# Patient Record
Sex: Female | Born: 1975 | Race: Black or African American | Hispanic: No | Marital: Single | Smoking: Current every day smoker
Health system: Southern US, Community
[De-identification: ages and names within clinical notes are randomized; demographics above are authoritative.]

## PROBLEM LIST (undated history)

## (undated) DIAGNOSIS — R590 Localized enlarged lymph nodes: Secondary | ICD-10-CM

## (undated) DIAGNOSIS — I10 Essential (primary) hypertension: Secondary | ICD-10-CM

## (undated) DIAGNOSIS — K219 Gastro-esophageal reflux disease without esophagitis: Secondary | ICD-10-CM

## (undated) DIAGNOSIS — D219 Benign neoplasm of connective and other soft tissue, unspecified: Secondary | ICD-10-CM

## (undated) DIAGNOSIS — N946 Dysmenorrhea, unspecified: Secondary | ICD-10-CM

## (undated) DIAGNOSIS — B009 Herpesviral infection, unspecified: Secondary | ICD-10-CM

## (undated) HISTORY — DX: Dysmenorrhea, unspecified: N94.6

## (undated) HISTORY — DX: Localized enlarged lymph nodes: R59.0

## (undated) HISTORY — DX: Gastro-esophageal reflux disease without esophagitis: K21.9

## (undated) HISTORY — DX: Benign neoplasm of connective and other soft tissue, unspecified: D21.9

## (undated) HISTORY — PX: BREAST BIOPSY: SHX20

## (undated) HISTORY — DX: Herpesviral infection, unspecified: B00.9

---

## 2000-12-24 ENCOUNTER — Encounter: Admission: RE | Admit: 2000-12-24 | Discharge: 2000-12-24 | Payer: Self-pay | Admitting: Family Medicine

## 2001-05-09 ENCOUNTER — Encounter (INDEPENDENT_AMBULATORY_CARE_PROVIDER_SITE_OTHER): Payer: Self-pay | Admitting: *Deleted

## 2001-06-08 ENCOUNTER — Encounter: Admission: RE | Admit: 2001-06-08 | Discharge: 2001-06-08 | Payer: Self-pay | Admitting: Family Medicine

## 2001-06-08 ENCOUNTER — Encounter (INDEPENDENT_AMBULATORY_CARE_PROVIDER_SITE_OTHER): Payer: Self-pay | Admitting: *Deleted

## 2001-06-10 ENCOUNTER — Encounter: Admission: RE | Admit: 2001-06-10 | Discharge: 2001-06-10 | Payer: Self-pay | Admitting: Family Medicine

## 2001-06-11 ENCOUNTER — Encounter: Admission: RE | Admit: 2001-06-11 | Discharge: 2001-06-11 | Payer: Self-pay | Admitting: Family Medicine

## 2001-06-15 ENCOUNTER — Encounter: Admission: RE | Admit: 2001-06-15 | Discharge: 2001-06-15 | Payer: Self-pay | Admitting: Family Medicine

## 2003-02-10 ENCOUNTER — Inpatient Hospital Stay (HOSPITAL_COMMUNITY): Admission: AD | Admit: 2003-02-10 | Discharge: 2003-02-10 | Payer: Self-pay | Admitting: Obstetrics & Gynecology

## 2004-08-23 ENCOUNTER — Inpatient Hospital Stay (HOSPITAL_COMMUNITY): Admission: AD | Admit: 2004-08-23 | Discharge: 2004-08-23 | Payer: Self-pay | Admitting: Obstetrics & Gynecology

## 2005-05-17 ENCOUNTER — Encounter: Admission: RE | Admit: 2005-05-17 | Discharge: 2005-05-17 | Payer: Self-pay | Admitting: Internal Medicine

## 2006-05-03 ENCOUNTER — Inpatient Hospital Stay (HOSPITAL_COMMUNITY): Admission: AD | Admit: 2006-05-03 | Discharge: 2006-05-03 | Payer: Self-pay | Admitting: Obstetrics & Gynecology

## 2006-05-09 ENCOUNTER — Encounter (INDEPENDENT_AMBULATORY_CARE_PROVIDER_SITE_OTHER): Payer: Self-pay | Admitting: *Deleted

## 2006-07-18 ENCOUNTER — Inpatient Hospital Stay (HOSPITAL_COMMUNITY): Admission: AD | Admit: 2006-07-18 | Discharge: 2006-07-18 | Payer: Self-pay | Admitting: Obstetrics

## 2006-08-07 ENCOUNTER — Inpatient Hospital Stay (HOSPITAL_COMMUNITY): Admission: AD | Admit: 2006-08-07 | Discharge: 2006-08-07 | Payer: Self-pay | Admitting: Obstetrics

## 2006-08-23 ENCOUNTER — Inpatient Hospital Stay (HOSPITAL_COMMUNITY): Admission: AD | Admit: 2006-08-23 | Discharge: 2006-08-23 | Payer: Self-pay | Admitting: Obstetrics & Gynecology

## 2006-08-25 ENCOUNTER — Encounter: Payer: Self-pay | Admitting: Obstetrics

## 2006-08-25 ENCOUNTER — Ambulatory Visit (HOSPITAL_COMMUNITY): Admission: AD | Admit: 2006-08-25 | Discharge: 2006-08-25 | Payer: Self-pay | Admitting: Obstetrics

## 2007-09-10 ENCOUNTER — Inpatient Hospital Stay (HOSPITAL_COMMUNITY): Admission: AD | Admit: 2007-09-10 | Discharge: 2007-09-10 | Payer: Self-pay | Admitting: Obstetrics & Gynecology

## 2007-09-14 ENCOUNTER — Ambulatory Visit (HOSPITAL_COMMUNITY): Admission: RE | Admit: 2007-09-14 | Discharge: 2007-09-14 | Payer: Self-pay | Admitting: Obstetrics

## 2007-09-15 ENCOUNTER — Emergency Department (HOSPITAL_COMMUNITY): Admission: EM | Admit: 2007-09-15 | Discharge: 2007-09-15 | Payer: Self-pay | Admitting: Emergency Medicine

## 2007-10-11 IMAGING — US US OB COMP LESS 14 WK
1 series · 14 of 28 positions shown · non-contrast
Comparison: none

CLINICAL DATA: 13-14 weeks pregnant, bleeding.

TRANSABDOMINAL AND TRANSVAGINAL OB ULTRASOUND, LESS THAN 14 WEEKS:

[Series 1: us ob comp +14 wk · 14 of 30 slices shown]
[im 2/30]
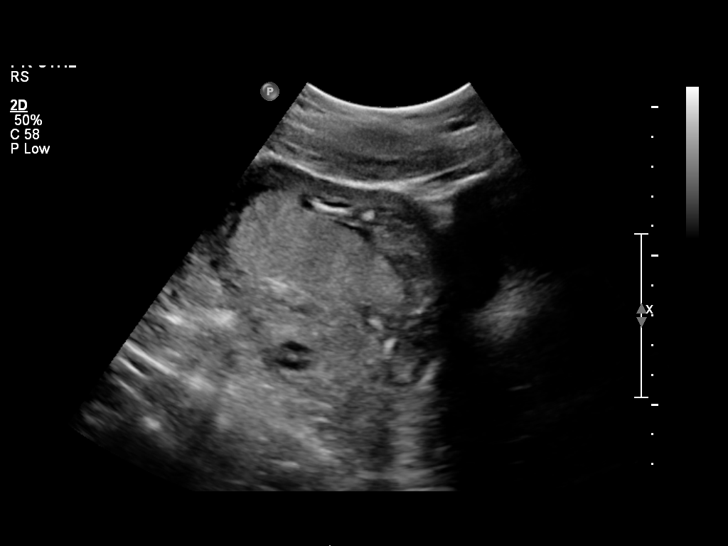
[im 4/30]
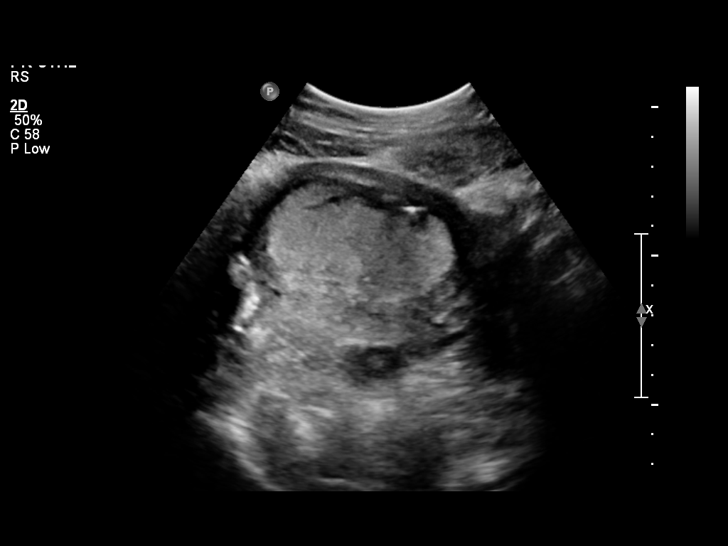
[im 6/30]
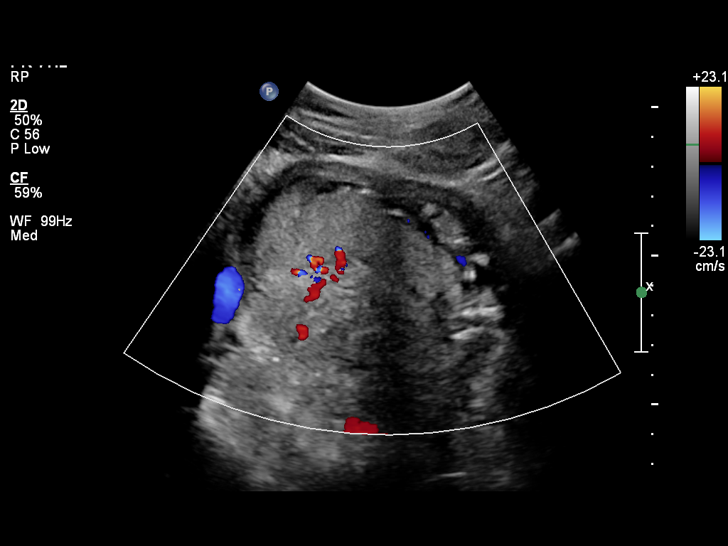
[im 8/30]
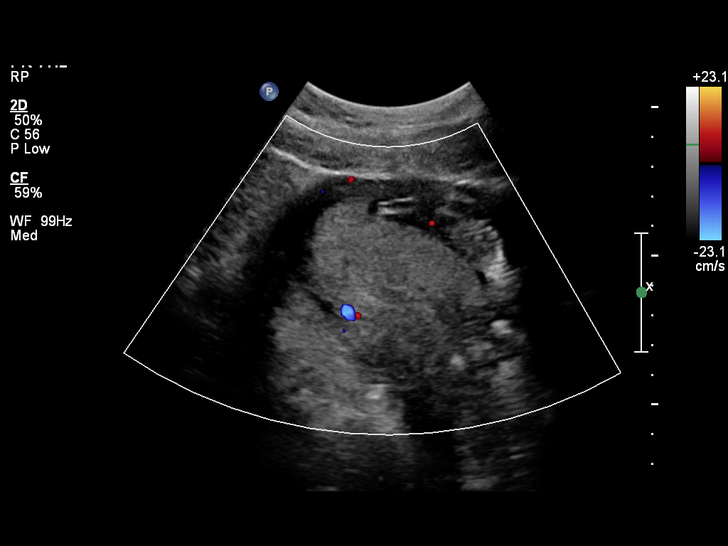
[im 10/30]
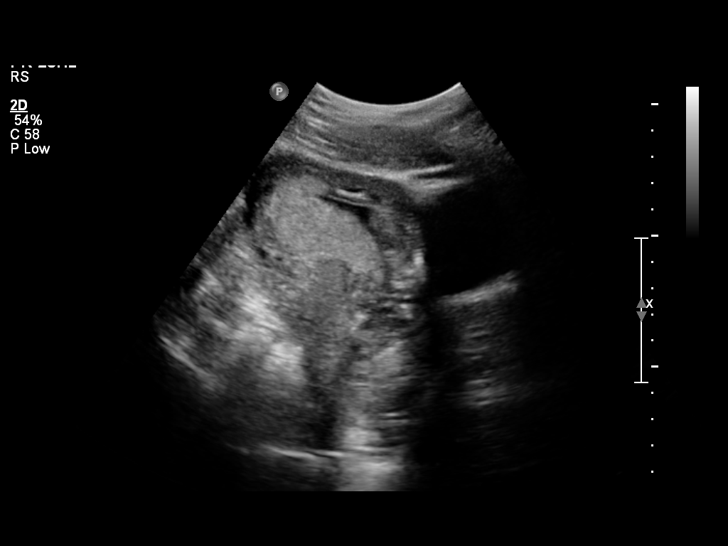
[im 12/30]
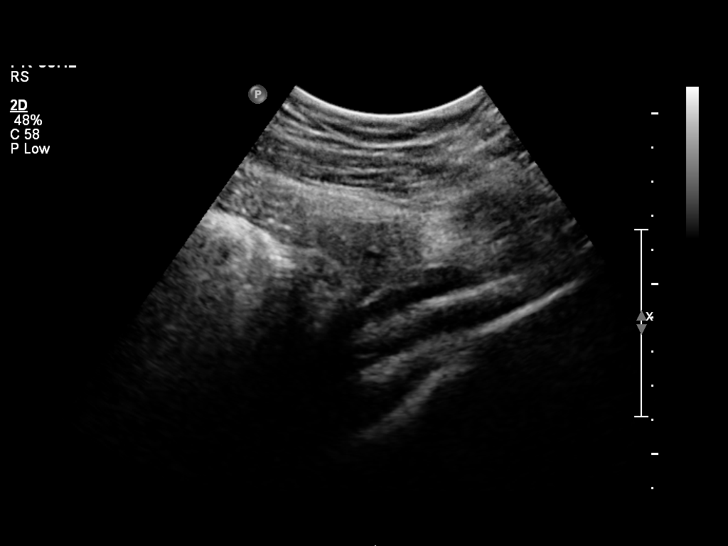
[im 14/30]
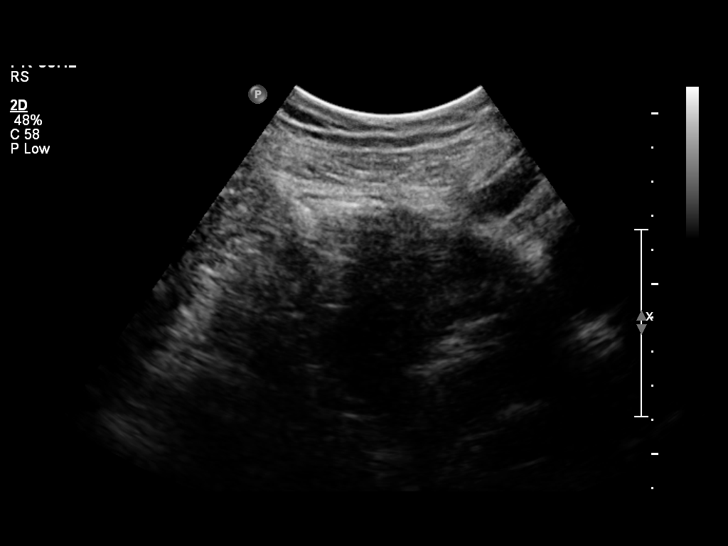
[im 17/30]
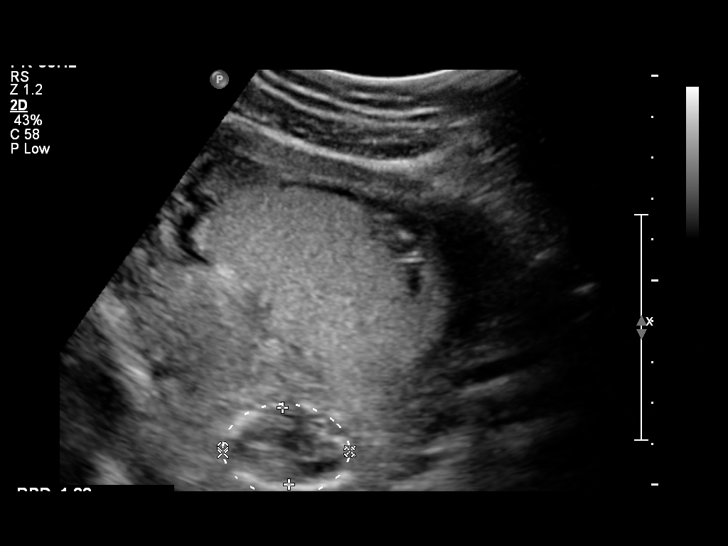
[im 19/30]
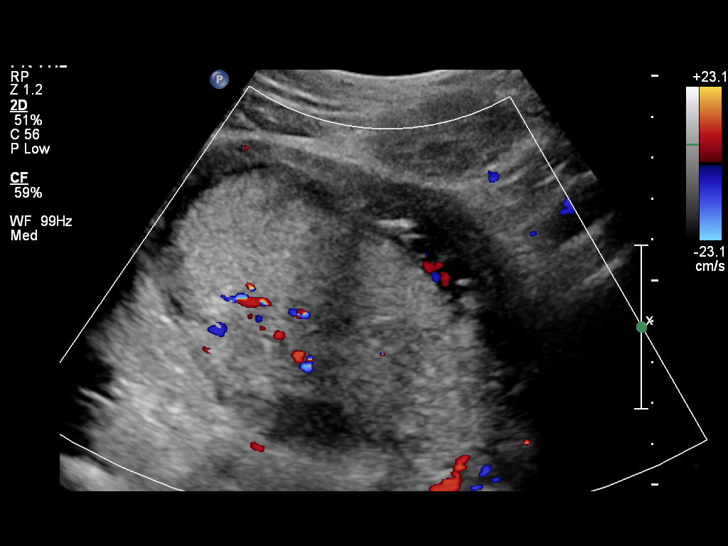
[im 21/30]
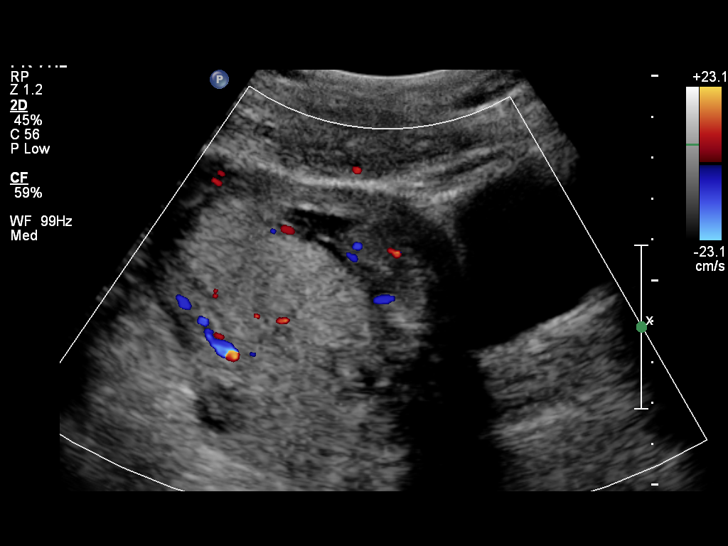
[im 23/30]
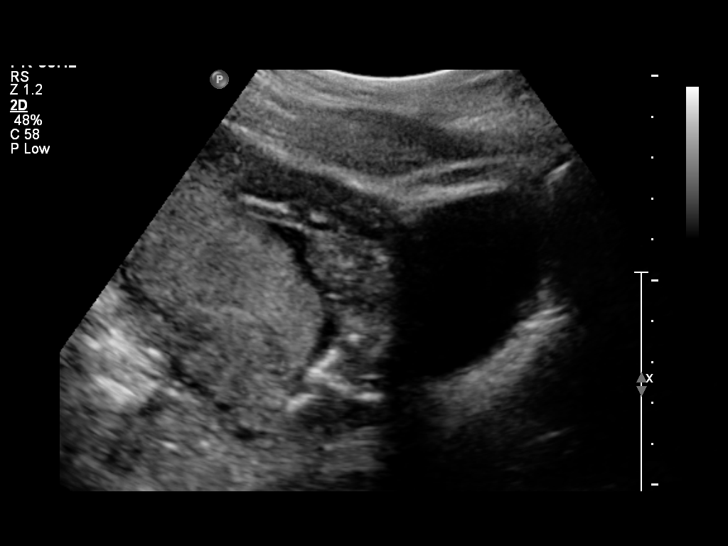
[im 25/30]
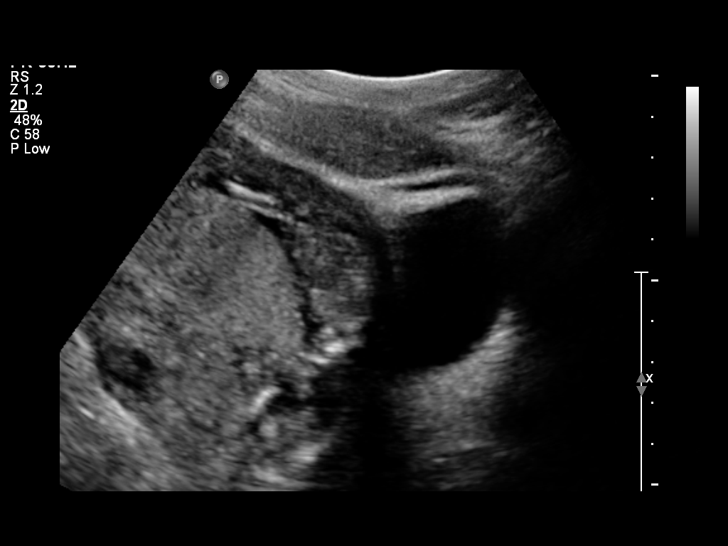
[im 27/30]
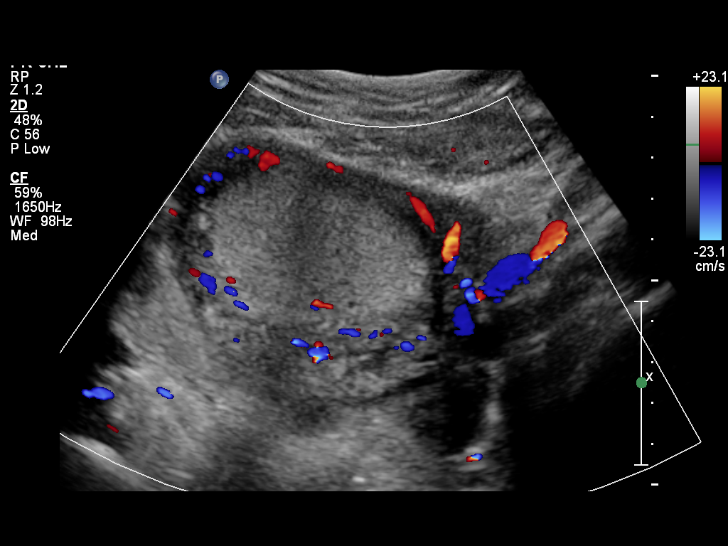
[im 30/30]
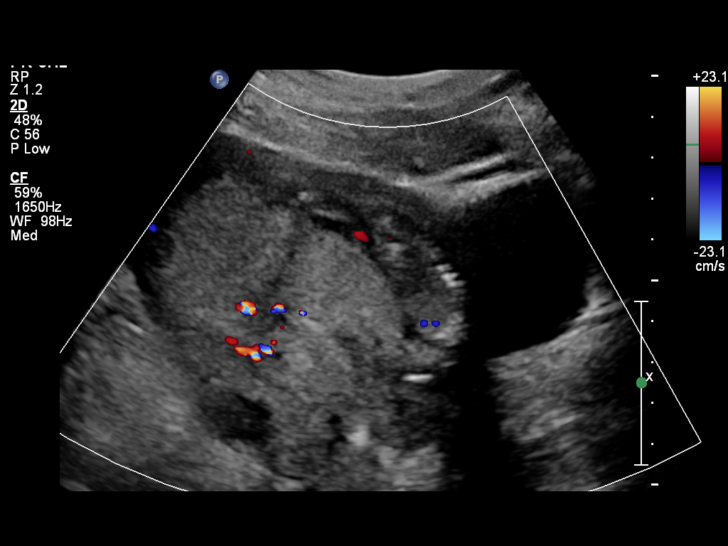

[14 of 28 positions shown; findings below may reference images not displayed]

FINDINGS: Amniotic fluid is subjectively decreased, changed since prior study,
with very difficult visualization of the fetus on the current exam. Given this
decrease in fluid, question rupture of membranes. Recommend clinical
correlation. Feet is very difficult to visualize, with posterior to the
placenta, heterogeneous density material is noted. This may represent a large
subchorionic hemorrhage.

Fetal measurements estimated gestational age at 13 weeks 5 days. Fetal heart
rate 172 beats per minute.
IMPRESSION: Very difficult to visualize fetus due to decreased amniotic fluid since prior
study. Question ruptured membranes. Recommend clinical correlation. Fetal heart
rate 172 beats per minute.

Very heterogeneous soft tissue posterior to the placenta suggesting the
possibility of large subchorionic hemorrhage.

## 2008-07-06 ENCOUNTER — Emergency Department (HOSPITAL_COMMUNITY): Admission: EM | Admit: 2008-07-06 | Discharge: 2008-07-06 | Payer: Self-pay | Admitting: Obstetrics & Gynecology

## 2010-06-20 LAB — POCT URINALYSIS DIP (DEVICE)
Nitrite: NEGATIVE
Specific Gravity, Urine: 1.015 (ref 1.005–1.030)
Urobilinogen, UA: 0.2 mg/dL (ref 0.0–1.0)
pH: 5.5 (ref 5.0–8.0)

## 2010-06-20 LAB — GC/CHLAMYDIA PROBE AMP, URINE
Chlamydia, Swab/Urine, PCR: NEGATIVE
GC Probe Amp, Urine: NEGATIVE

## 2010-07-24 NOTE — Op Note (Signed)
Tiffany [name], Tiffany [name]                ACCOUNT NO.:  [id number]   MEDICAL RECORD NO.:  [id number]          PATIENT TYPE:  MAT   LOCATION:  [facility]                          FACILITY:  [facility]   PHYSICIAN:  Charles A. Clearance Coots, M.D.DATE OF BIRTH:  Oct 14, 1975   DATE OF PROCEDURE:  08/25/2006  DATE OF DISCHARGE:                               OPERATIVE REPORT   PREOPERATIVE DIAGNOSIS:  Spontaneous abortion, incomplete.   POSTOPERATIVE DIAGNOSIS:  Spontaneous abortion, incomplete.   PROCEDURE:  Suction dilation and evacuation.   SURGEON:  Coral Ceo, M.D.   ANESTHESIA:  MAC with paracervical block.   ESTIMATED BLOOD LOSS:  100 mL   COMPLICATIONS:  None.   SPECIMEN:  Products of conception.   OPERATION:  The patient was brought to operating room and after  satisfactory IV sedation, legs were brought up in stirrups and the  vagina was prepped and draped in the usual sterile fashion. The urinary  bladder was emptied of approximately 50 mL clear urine.  Bimanual  examination revealed uterus to be mid position. A sterile speculum was  inserted vaginal vault and cervix was noted to be dilated with products  of conception in the cervical os. The products of conception were  removed from the cervical os with a ring forceps and submitted to  pathology for evaluation. Paracervical block of 20 mL of 1% Xylocaine  was injected, 10 mL in each lateral fornix. A #10 suction cath was then  easily introduced to the uterine cavity and all contents were evacuated.  The endometrial surface felt gritty at the conclusion of the procedure.  The endometrial surface was then curetted with medium sharp curette and  the scant amount of products of conception were obtained and submitted  to pathology for evaluation.  There was no active bleeding at the  conclusion of the procedure. All instruments were retired.  Bimanual  examination revealed uterus to be contracted down quite well. The  patient tolerated the  procedure well, transported to recovery room in  satisfactory condition.      Charles A. Clearance Coots, M.D.  Electronically Signed     CAH/MEDQ  D:  08/25/2006  T:  08/25/2006  Job:  [id number]

## 2010-12-06 LAB — URINALYSIS, ROUTINE W REFLEX MICROSCOPIC
Nitrite: NEGATIVE
Protein, ur: NEGATIVE
Specific Gravity, Urine: >1.03 — ABNORMAL HIGH
pH: 5.5

## 2010-12-06 LAB — POCT PREGNANCY, URINE
Operator id: 23433
Preg Test, Ur: NEGATIVE

## 2010-12-06 LAB — URINE MICROSCOPIC-ADD ON

## 2010-12-26 LAB — CBC
Hemoglobin: 12.4
MCV: 97.3
RDW: 12.8

## 2010-12-26 LAB — SAMPLE TO BLOOD BANK

## 2010-12-27 LAB — CBC
HCT: 35.8 — ABNORMAL LOW
Hemoglobin: 12.5
MCHC: 35
MCV: 97.2
Platelets: 305
RDW: 12.7
WBC: 9.4

## 2011-08-22 ENCOUNTER — Other Ambulatory Visit: Payer: Self-pay | Admitting: Internal Medicine

## 2011-08-22 DIAGNOSIS — N83209 Unspecified ovarian cyst, unspecified side: Secondary | ICD-10-CM

## 2011-08-26 ENCOUNTER — Ambulatory Visit
Admission: RE | Admit: 2011-08-26 | Discharge: 2011-08-26 | Disposition: A | Discharge status: Home / Self Care | Payer: Medicaid Other | Source: Ambulatory Visit | Attending: Internal Medicine | Admitting: Internal Medicine

## 2011-08-26 DIAGNOSIS — N83209 Unspecified ovarian cyst, unspecified side: Secondary | ICD-10-CM

## 2011-12-04 LAB — CBC AND DIFFERENTIAL
HCT: 46 % (ref 36–46)
Hemoglobin: 15.7 g/dL (ref 12.0–16.0)
Platelets: 286 10*3/uL (ref 150–399)

## 2011-12-06 LAB — HM PAP SMEAR: HM Pap smear: NEGATIVE

## 2012-05-05 ENCOUNTER — Encounter: Payer: Self-pay | Admitting: *Deleted

## 2012-05-08 ENCOUNTER — Encounter: Payer: Self-pay | Admitting: *Deleted

## 2012-08-27 ENCOUNTER — Ambulatory Visit (INDEPENDENT_AMBULATORY_CARE_PROVIDER_SITE_OTHER): Payer: Medicaid Other | Admitting: Obstetrics & Gynecology

## 2012-08-27 ENCOUNTER — Encounter: Payer: Self-pay | Admitting: Obstetrics & Gynecology

## 2012-08-27 VITALS — BP 129/80 | HR 87 | Temp 98.1°F | Ht 67.5 in | Wt 165.4 lb

## 2012-08-27 DIAGNOSIS — N926 Irregular menstruation, unspecified: Secondary | ICD-10-CM

## 2012-08-27 DIAGNOSIS — N946 Dysmenorrhea, unspecified: Secondary | ICD-10-CM

## 2012-08-27 NOTE — Progress Notes (Signed)
.   Subjective:     Tiffany [name] is a 37 y.o. female here for a follow up exam with her birth control (abnormal bleeding).  No current complaints.  Personal health questionnaire reviewed: no.   Gynecologic History No LMP recorded. Contraception: Seasonique Last Pap: 11/2011. Results were: N/A Last mammogram: N/A  Obstetric History OB History   Grav Para Term Preterm Abortions TAB SAB Ect Mult Living   2 1  1 1  1         # Outc Date GA Lbr Len/2nd Wgt Sex Del Anes PTL Lv   1 PRE 1/96 [redacted]w[redacted]d  1lb5oz(0.595kg)     SB   2 SAB 6/08 [redacted]w[redacted]d              The following portions of the patient's history were reviewed and updated as appropriate: allergies, current medications, past family history, past medical history, past social history, past surgical history and problem list.  Review of Systems Pertinent items are noted in HPI.    Objective:     No exam today     Assessment:   Doing well w/COCP  Plan:   Return in September for annual

## 2012-08-27 NOTE — Patient Instructions (Signed)

## 2012-12-07 ENCOUNTER — Ambulatory Visit: Payer: Medicaid Other | Admitting: Obstetrics & Gynecology

## 2013-02-03 ENCOUNTER — Telehealth: Payer: Self-pay | Admitting: *Deleted

## 2013-05-05 ENCOUNTER — Encounter: Payer: Self-pay | Admitting: Obstetrics & Gynecology

## 2013-05-05 ENCOUNTER — Ambulatory Visit (INDEPENDENT_AMBULATORY_CARE_PROVIDER_SITE_OTHER): Payer: BC Managed Care – PPO | Admitting: Obstetrics & Gynecology

## 2013-05-05 VITALS — BP 127/82 | HR 81 | Temp 97.9°F | Ht 67.5 in | Wt 159.0 lb

## 2013-05-05 DIAGNOSIS — Z124 Encounter for screening for malignant neoplasm of cervix: Secondary | ICD-10-CM

## 2013-05-05 DIAGNOSIS — Z113 Encounter for screening for infections with a predominantly sexual mode of transmission: Secondary | ICD-10-CM

## 2013-05-05 DIAGNOSIS — Z01419 Encounter for gynecological examination (general) (routine) without abnormal findings: Secondary | ICD-10-CM

## 2013-05-05 DIAGNOSIS — Z13220 Encounter for screening for lipoid disorders: Secondary | ICD-10-CM

## 2013-05-05 MED ORDER — NORETHINDRONE 0.35 MG PO TABS: 1.0000 | ORAL_TABLET | Freq: Every day | ORAL | Status: DC

## 2013-05-05 NOTE — Progress Notes (Signed)
Subjective:     Tiffany [name] is a 38 y.o. female here for a routine exam.  Current complaints: Patient is in the office today for an Annual Exam. Patient states she takes her birth control pill everyday and vitamin B12 everyday she would like to know what effect it will have on her kidneys.   Personal health questionnaire reviewed: yes.   Gynecologic History No LMP recorded. Patient is not currently having periods (Reason: Oral contraceptives). Contraception: OCP (estrogen/progesterone) Last Pap: 12/04/2011. Results were: normal  Obstetric History OB History  Gravida Para Term Preterm AB SAB TAB Ectopic Multiple Living  2 1  1 1 1         # Outcome Date GA Lbr Len/2nd Weight Sex Delivery Anes PTL Lv  2 SAB 08/25/06 [redacted]w[redacted]d         1 PRE 03/11/94 [redacted]w[redacted]d  1 lb 5 oz (0.595 kg)     SB       The following portions of the patient's history were reviewed and updated as appropriate: allergies, current medications, past family history, past medical history, past social history, past surgical history and problem list.  Review of Systems Pertinent items are noted in HPI.    Objective:    BP 127/82  Pulse 81  Temp(Src) 97.9 F (36.6 C)  Ht 5' 7.5" (1.715 m)  Wt 159 lb (72.122 kg)  BMI 24.52 kg/m2  General Appearance:    Alert, cooperative, no distress, appears stated age  Breast Exam:    No tenderness, masses, or nipple abnormality  Abdomen:     Soft, non-tender, bowel sounds active all four quadrants,    no masses, no organomegaly  Genitalia:    Normal female without lesion, discharge or tenderness   Assessment:   Healthy female exam.   Plan:   Yearly papsmears Change to [place] in light of the pt's smoking history Counseled re: smoking cessation

## 2013-05-06 LAB — LIPID PANEL
CHOL/HDL RATIO: 3 ratio
Cholesterol: 160 mg/dL (ref 0–200)
HDL: 53 mg/dL (ref >39)
LDL CALC: 74 mg/dL (ref 0–99)
Triglycerides: 167 mg/dL — ABNORMAL HIGH (ref <150)
VLDL: 33 mg/dL (ref 0–40)

## 2013-05-06 LAB — HIV ANTIBODY (ROUTINE TESTING W REFLEX): HIV: NONREACTIVE

## 2013-05-06 LAB — RPR

## 2013-05-06 NOTE — Patient Instructions (Signed)
Smoking Cessation Quitting smoking is important to your health and has many advantages. However, it is not always easy to quit since nicotine is a very addictive drug. Often times, people try 3 times or more before being able to quit. This document explains the best ways for you to prepare to quit smoking. Quitting takes hard work and a lot of effort, but you can do it. ADVANTAGES OF QUITTING SMOKING  You will live longer, feel better, and live better.  Your body will feel the impact of quitting smoking almost immediately.  Within 20 minutes, blood pressure decreases. Your pulse returns to its normal level.  After 8 hours, carbon monoxide levels in the blood return to normal. Your oxygen level increases.  After 24 hours, the chance of having a heart attack starts to decrease. Your breath, hair, and body stop smelling like smoke.  After 48 hours, damaged nerve endings begin to recover. Your sense of taste and smell improve.  After 72 hours, the body is virtually free of nicotine. Your bronchial tubes relax and breathing becomes easier.  After 2 to 12 weeks, lungs can hold more air. Exercise becomes easier and circulation improves.  The risk of having a heart attack, stroke, cancer, or lung disease is greatly reduced.  After 1 year, the risk of coronary heart disease is cut in half.  After 5 years, the risk of stroke falls to the same as a nonsmoker.  After 10 years, the risk of lung cancer is cut in half and the risk of other cancers decreases significantly.  After 15 years, the risk of coronary heart disease drops, usually to the level of a nonsmoker.  If you are pregnant, quitting smoking will improve your chances of having a healthy baby.  The people you live with, especially any children, will be healthier.  You will have extra money to spend on things other than cigarettes. QUESTIONS TO THINK ABOUT BEFORE ATTEMPTING TO QUIT You may want to talk about your answers with your  caregiver.  Why do you want to quit?  If you tried to quit in the past, what helped and what did not?  What will be the most difficult situations for you after you quit? How will you plan to handle them?  Who can help you through the tough times? Your family? Friends? A caregiver?  What pleasures do you get from smoking? What ways can you still get pleasure if you quit? Here are some questions to ask your caregiver:  How can you help me to be successful at quitting?  What medicine do you think would be best for me and how should I take it?  What should I do if I need more help?  What is smoking withdrawal like? How can I get information on withdrawal? GET READY  Set a quit date.  Change your environment by getting rid of all cigarettes, ashtrays, matches, and lighters in your home, car, or work. Do not let people smoke in your home.  Review your past attempts to quit. Think about what worked and what did not. GET SUPPORT AND ENCOURAGEMENT You have a better chance of being successful if you have help. You can get support in many ways.  Tell your family, friends, and co-workers that you are going to quit and need their support. Ask them not to smoke around you.  Get individual, group, or telephone counseling and support. Programs are available at [employer] and health centers. Call your local health department for  information about programs in your area.  Spiritual beliefs and practices may help some smokers quit.  Download a "quit meter" on your computer to keep track of quit statistics, such as how long you have gone without smoking, cigarettes not smoked, and money saved.  Get a self-help book about quitting smoking and staying off of tobacco. [place] yourself from urges to smoke. Talk to someone, go for a walk, or occupy your time with a task.  Change your normal routine. Take a different route to work. Drink tea instead of coffee.  Eat breakfast in a different place.  Reduce your stress. Take a hot bath, exercise, or read a book.  Plan something enjoyable to do every day. Reward yourself for not smoking.  Explore interactive web-based programs that specialize in helping you quit. GET MEDICINE AND USE IT CORRECTLY Medicines can help you stop smoking and decrease the urge to smoke. Combining medicine with the above behavioral methods and support can greatly increase your chances of successfully quitting smoking.  Nicotine replacement therapy helps deliver nicotine to your body without the negative effects and risks of smoking. Nicotine replacement therapy includes nicotine gum, lozenges, inhalers, nasal sprays, and skin patches. Some may be available over-the-counter and others require a prescription.  Antidepressant medicine helps people abstain from smoking, but how this works is unknown. This medicine is available by prescription.  Nicotinic receptor partial agonist medicine simulates the effect of nicotine in your brain. This medicine is available by prescription. Ask your caregiver for advice about which medicines to use and how to use them based on your health history. Your caregiver will tell you what side effects to look out for if you choose to be on a medicine or therapy. Carefully read the information on the package. Do not use any other product containing nicotine while using a nicotine replacement product.  RELAPSE OR DIFFICULT SITUATIONS Most relapses occur within the first 3 months after quitting. Do not be discouraged if you start smoking again. Remember, most people try several times before finally quitting. You may have symptoms of withdrawal because your body is used to nicotine. You may crave cigarettes, be irritable, feel very hungry, cough often, get headaches, or have difficulty concentrating. The withdrawal symptoms are only temporary. They are strongest when you first quit, but they will go away within  10 14 days. To reduce the chances of relapse, try to:  Avoid drinking alcohol. Drinking lowers your chances of successfully quitting.  Reduce the amount of caffeine you consume. Once you quit smoking, the amount of caffeine in your body increases and can give you symptoms, such as a rapid heartbeat, sweating, and anxiety.  Avoid smokers because they can make you want to smoke.  Do not let weight gain distract you. Many smokers will gain weight when they quit, usually less than 10 pounds. Eat a healthy diet and stay active. You can always lose the weight gained after you quit.  Find ways to improve your mood other than smoking. FOR MORE INFORMATION  www.smokefree.gov  Document Released: 02/19/2001 Document Revised: 08/27/2011 Document Reviewed: 06/06/2011 [facility] Patient Information 2014 [place], [place]. Health Maintenance, Female A healthy lifestyle and preventative care can promote health and wellness.  Maintain regular health, dental, and eye exams.  Eat a healthy diet. Foods like vegetables, fruits, whole grains, low-fat dairy products, and lean protein foods contain the nutrients you need without too many calories. Decrease your intake of foods high in solid fats,  added sugars, and salt. Get information about a proper diet from your caregiver, if necessary.  Regular physical exercise is one of the most important things you can do for your health. Most adults should get at least 150 minutes of moderate-intensity exercise (any activity that increases your heart rate and causes you to sweat) each week. In addition, most adults need muscle-strengthening exercises on 2 or more days a week.   Maintain a healthy weight. The body mass index (BMI) is a screening tool to identify possible weight problems. It provides an estimate of body fat based on height and weight. Your caregiver can help determine your BMI, and can help you achieve or maintain a healthy weight. For adults 20 years and  older:  A BMI below 18.5 is considered underweight.  A BMI of 18.5 to 24.9 is normal.  A BMI of 25 to 29.9 is considered overweight.  A BMI of 30 and above is considered obese.  Maintain normal blood lipids and cholesterol by exercising and minimizing your intake of saturated fat. Eat a balanced diet with plenty of fruits and vegetables. Blood tests for lipids and cholesterol should begin at age 37 and be repeated every 5 years. If your lipid or cholesterol levels are high, you are over 50, or you are a high risk for heart disease, you may need your cholesterol levels checked more frequently.Ongoing high lipid and cholesterol levels should be treated with medicines if diet and exercise are not effective.  If you smoke, find out from your caregiver how to quit. If you do not use tobacco, do not start.  Lung cancer screening is recommended for adults aged 68 80 years who are at high risk for developing lung cancer because of a history of smoking. Yearly low-dose computed tomography (CT) is recommended for people who have at least a 30-pack-year history of smoking and are a current smoker or have quit within the past 15 years. A pack year of smoking is smoking an average of 1 pack of cigarettes a day for 1 year (for example: 1 pack a day for 30 years or 2 packs a day for 15 years). Yearly screening should continue until the smoker has stopped smoking for at least 15 years. Yearly screening should also be stopped for people who develop a health problem that would prevent them from having lung cancer treatment.  If you are pregnant, do not drink alcohol. If you are breastfeeding, be very cautious about drinking alcohol. If you are not pregnant and choose to drink alcohol, do not exceed 1 drink per day. One drink is considered to be 12 ounces (355 mL) of beer, 5 ounces (148 mL) of wine, or 1.5 ounces (44 mL) of liquor.  Avoid use of street drugs. Do not share needles with anyone. Ask for help if you  need support or instructions about stopping the use of drugs.  High blood pressure causes heart disease and increases the risk of stroke. Blood pressure should be checked at least every 1 to 2 years. Ongoing high blood pressure should be treated with medicines, if weight loss and exercise are not effective.  If you are 58 to 36 years old, ask your caregiver if you should take aspirin to prevent strokes.  Diabetes screening involves taking a blood sample to check your fasting blood sugar level. This should be done once every 3 years, after age 60, if you are within normal weight and without risk factors for diabetes. Testing should be considered at  a younger age or be carried out more frequently if you are overweight and have at least 1 risk factor for diabetes.  Breast cancer screening is essential preventative care for women. You should practice "breast self-awareness." This means understanding the normal appearance and feel of your breasts and may include breast self-examination. Any changes detected, no matter how small, should be reported to a caregiver. Women in their 38s and 30s should have a clinical breast exam (CBE) by a caregiver as part of a regular health exam every 1 to 3 years. After age 70, women should have a CBE every year. Starting at age 36, women should consider having a mammogram (breast X-ray) every year. Women who have a family history of breast cancer should talk to their caregiver about genetic screening. Women at a high risk of breast cancer should talk to their caregiver about having an MRI and a mammogram every year.  Breast cancer gene (BRCA)-related cancer risk assessment is recommended for women who have family members with BRCA-related cancers. BRCA-related cancers include breast, ovarian, tubal, and peritoneal cancers. Having family members with these cancers may be associated with an increased risk for harmful changes (mutations) in the breast cancer genes BRCA1 and BRCA2.  Results of the assessment will determine the need for genetic counseling and BRCA1 and BRCA2 testing.  The Pap test is a screening test for cervical cancer. Women should have a Pap test starting at age 25. Between ages 86 and 39, Pap tests should be repeated every 2 years. Beginning at age 78, you should have a Pap test every 3 years as long as the past 3 Pap tests have been normal. If you had a hysterectomy for a problem that was not cancer or a condition that could lead to cancer, then you no longer need Pap tests. If you are between ages 90 and 59, and you have had normal Pap tests going back 10 years, you no longer need Pap tests. If you have had past treatment for cervical cancer or a condition that could lead to cancer, you need Pap tests and screening for cancer for at least 20 years after your treatment. If Pap tests have been discontinued, risk factors (such as a new sexual partner) need to be reassessed to determine if screening should be resumed. Some women have medical problems that increase the chance of getting cervical cancer. In these cases, your caregiver may recommend more frequent screening and Pap tests.  The human papillomavirus (HPV) test is an additional test that may be used for cervical cancer screening. The HPV test looks for the virus that can cause the cell changes on the cervix. The cells collected during the Pap test can be tested for HPV. The HPV test could be used to screen women aged 71 years and older, and should be used in women of any age who have unclear Pap test results. After the age of 21, women should have HPV testing at the same frequency as a Pap test.  Colorectal cancer can be detected and often prevented. Most routine colorectal cancer screening begins at the age of 11 and continues through age 54. However, your caregiver may recommend screening at an earlier age if you have risk factors for colon cancer. On a yearly basis, your caregiver may provide home test kits  to check for hidden blood in the stool. Use of a small camera at the end of a tube, to directly examine the colon (sigmoidoscopy or colonoscopy), can detect the earliest  forms of colorectal cancer. Talk to your caregiver about this at age 7, when routine screening begins. Direct examination of the colon should be repeated every 5 to 10 years through age 40, unless early forms of pre-cancerous polyps or small growths are found.  Hepatitis C blood testing is recommended for all people born from [birth year] through 1963 and any individual with known risks for hepatitis C.  Practice safe sex. Use condoms and avoid high-risk sexual practices to reduce the spread of sexually transmitted infections (STIs). Sexually active women aged 66 and younger should be checked for Chlamydia, which is a common sexually transmitted infection. Older women with new or multiple partners should also be tested for Chlamydia. Testing for other STIs is recommended if you are sexually active and at increased risk.  Osteoporosis is a disease in which the bones lose minerals and strength with aging. This can result in serious bone fractures. The risk of osteoporosis can be identified using a bone density scan. Women ages 52 and over and women at risk for fractures or osteoporosis should discuss screening with their caregivers. Ask your caregiver whether you should be taking a calcium supplement or vitamin D to reduce the rate of osteoporosis.  Menopause can be associated with physical symptoms and risks. Hormone replacement therapy is available to decrease symptoms and risks. You should talk to your caregiver about whether hormone replacement therapy is right for you.  Use sunscreen. Apply sunscreen liberally and repeatedly throughout the day. You should seek shade when your shadow is shorter than you. Protect yourself by wearing long sleeves, pants, a wide-brimmed hat, and sunglasses year round, whenever you are outdoors.  Notify your  caregiver of new moles or changes in moles, especially if there is a change in shape or color. Also notify your caregiver if a mole is larger than the size of a pencil eraser.  Stay current with your immunizations. Document Released: 09/10/2010 Document Revised: 06/22/2012 Document Reviewed: 09/10/2010 [facility] Patient Information 2014 [place].

## 2013-05-07 LAB — PAP IG, CT-NG, RFX HPV ASCU
CHLAMYDIA PROBE AMP: NEGATIVE
GC PROBE AMP: NEGATIVE

## 2013-05-07 LAB — HSV(HERPES SIMPLEX VRS) I + II AB-IGG
HSV 1 Glycoprotein G Ab, IgG: 9.42 IV — ABNORMAL HIGH
HSV 2 Glycoprotein G Ab, IgG: 11.97 IV — ABNORMAL HIGH

## 2013-05-11 ENCOUNTER — Encounter: Payer: Self-pay | Admitting: Obstetrics & Gynecology

## 2013-05-11 DIAGNOSIS — A6000 Herpesviral infection of urogenital system, unspecified: Secondary | ICD-10-CM | POA: Insufficient documentation

## 2014-01-10 ENCOUNTER — Encounter: Payer: Self-pay | Admitting: Obstetrics & Gynecology

## 2014-03-07 ENCOUNTER — Encounter: Payer: Self-pay | Admitting: *Deleted

## 2014-03-08 ENCOUNTER — Encounter: Payer: Self-pay | Admitting: Obstetrics & Gynecology

## 2014-04-07 NOTE — Telephone Encounter (Signed)
error 

## 2014-04-11 ENCOUNTER — Other Ambulatory Visit: Payer: Self-pay | Admitting: *Deleted

## 2014-04-11 DIAGNOSIS — N926 Irregular menstruation, unspecified: Secondary | ICD-10-CM

## 2014-04-11 MED ORDER — NORETHINDRONE 0.35 MG PO TABS: 1.0000 | ORAL_TABLET | Freq: Every day | ORAL | Status: DC

## 2014-05-05 ENCOUNTER — Ambulatory Visit: Payer: Self-pay | Admitting: Certified Nurse Midwife

## 2014-05-05 ENCOUNTER — Ambulatory Visit: Payer: BC Managed Care – PPO | Admitting: Obstetrics & Gynecology

## 2014-05-18 ENCOUNTER — Encounter: Payer: Self-pay | Admitting: Certified Nurse Midwife

## 2014-05-18 ENCOUNTER — Ambulatory Visit (INDEPENDENT_AMBULATORY_CARE_PROVIDER_SITE_OTHER): Payer: BLUE CROSS/BLUE SHIELD | Admitting: Certified Nurse Midwife

## 2014-05-18 VITALS — BP 130/85 | HR 94 | Temp 98.6°F | Wt 160.0 lb

## 2014-05-18 DIAGNOSIS — Z01419 Encounter for gynecological examination (general) (routine) without abnormal findings: Secondary | ICD-10-CM

## 2014-05-18 DIAGNOSIS — R350 Frequency of micturition: Secondary | ICD-10-CM

## 2014-05-18 DIAGNOSIS — R030 Elevated blood-pressure reading, without diagnosis of hypertension: Secondary | ICD-10-CM

## 2014-05-18 DIAGNOSIS — N946 Dysmenorrhea, unspecified: Secondary | ICD-10-CM

## 2014-05-18 DIAGNOSIS — Z113 Encounter for screening for infections with a predominantly sexual mode of transmission: Secondary | ICD-10-CM

## 2014-05-18 DIAGNOSIS — F17200 Nicotine dependence, unspecified, uncomplicated: Secondary | ICD-10-CM

## 2014-05-18 DIAGNOSIS — IMO0001 Reserved for inherently not codable concepts without codable children: Secondary | ICD-10-CM

## 2014-05-18 DIAGNOSIS — M79671 Pain in right foot: Secondary | ICD-10-CM

## 2014-05-18 NOTE — Progress Notes (Signed)
Patient ID: Tiffany [name], female   DOB: 1975/07/16, 39 y.o.   MRN: [id number]   Subjective:     Tiffany [name] is a 39 y.o. female here for a routine exam.  Current complaints: right foot pain, needs internal medicine provider, and reports frequent urination >10X/hr. Denies dysuria or hematuria.  Strong family hx of BCA <33 years of age in the family.  Declines change in OCP at this time.  Is a current smoker, declines medication at this time and wants to try to quit on her own.    Has a hx of fibroids.  Does have cyclical left ovary pain.  Does not want any more children.    Personal health questionnaire:  Is patient [name], have a family history of breast and/or ovarian cancer: yes Is there a family history of uterine cancer diagnosed at age < 17, gastrointestinal cancer, urinary tract cancer, family member who is a Field seismologist syndrome-associated carrier: yes Is the patient overweight and hypertensive, family history of diabetes, personal history of gestational diabetes, preeclampsia or PCOS: yes Is patient over 71, have PCOS,  family history of premature CHD under age 16, diabetes, smoke, have hypertension or peripheral artery disease:  no At any time, has a partner hit, kicked or otherwise hurt or frightened you?: no Over the past 2 weeks, have you felt down, depressed or hopeless?: no Over the past 2 weeks, have you felt little interest or pleasure in doing things?:no   Gynecologic History No LMP recorded. Patient is not currently having periods (Reason: Oral contraceptives). Contraception: OCP (estrogen/progesterone) Last Pap: 01/02/14. Results were: normal Last mammogram: none.   Obstetric History OB History  Gravida Para Term Preterm AB SAB TAB Ectopic Multiple Living  2 1  1 1 1         # Outcome Date GA Lbr Len/2nd Weight Sex Delivery Anes PTL Lv  2 SAB 08/25/06 [redacted]w[redacted]d         1 Preterm 03/11/94 [redacted]w[redacted]d  0.595 kg (1 lb 5 oz)     FD      History reviewed. No pertinent past  medical history.  Past Surgical History  Procedure Laterality Date  . Cesarean section       Current outpatient prescriptions:  .  norethindrone (MICRONOR,CAMILA,ERRIN) 0.35 MG tablet, Take 1 tablet (0.35 mg total) by mouth daily., Disp: 1 Package, Rfl: 5 .  vitamin B-12 (CYANOCOBALAMIN) 100 MCG tablet, Take 50 mcg by mouth daily., Disp: , Rfl:  No Known Allergies  History  Substance Use Topics  . Smoking status: Current Every Day Smoker -- 0.50 packs/day for 10 years    Types: Cigarettes    Last Attempt to Quit: 01/10/2012  . Smokeless tobacco: Never Used  . Alcohol Use: 1.8 oz/week    3 Shots of liquor per week    Family History  Problem Relation Age of Onset  . Hypertension Mother   . Kidney disease Mother   . Diabetes Maternal Aunt   . Cancer Maternal Aunt     breast  . Hypertension Maternal Grandmother       Review of Systems  Constitutional: negative for fatigue and weight loss Respiratory: negative for cough and wheezing Cardiovascular: negative for chest pain, fatigue and palpitations Gastrointestinal: negative for abdominal pain and change in bowel habits Musculoskeletal:negative for myalgias Neurological: negative for gait problems and tremors Behavioral/Psych: negative for abusive relationship, depression Endocrine: negative for temperature intolerance   Genitourinary:negative for abnormal menstrual periods, genital lesions, hot flashes, sexual  problems and vaginal discharge Integument/breast: negative for breast lump, breast tenderness, nipple discharge and skin lesion(s)    Objective:       BP 130/85 mmHg  Pulse 94  Temp(Src) 98.6 F (37 C)  Wt 72.576 kg (160 lb)  LMP  General:   alert  Skin:   no rash or abnormalities  Lungs:   clear to auscultation bilaterally  Heart:   regular rate and rhythm, S1, S2 normal, no murmur, click, rub or gallop  Breasts:   normal without suspicious masses, skin or nipple changes or axillary nodes  Abdomen:  normal  findings: no organomegaly, soft, non-tender and no hernia  Pelvis:  External genitalia: normal general appearance Urinary system: urethral meatus normal and bladder without fullness, nontender Vaginal: normal without tenderness, induration or masses Cervix: normal appearance Adnexa: normal bimanual exam Uterus: anteverted and non-tender, normal size   Lab Review Urine pregnancy test Labs reviewed yes Radiologic studies reviewed yes    Assessment:    Healthy female exam.   Dysmenorrhea Polyuria   Plan:    Education reviewed: low fat, low cholesterol diet, safe sex/STD prevention, self breast exams, skin cancer screening, smoking cessation and weight bearing exercise. Contraception: OCP (estrogen/progesterone). Mammogram ordered. Follow up in: 3 months.   No orders of the defined types were placed in this encounter.   Orders Placed This Encounter  Procedures  . SureSwab, Vaginosis/Vaginitis Plus  . MM DIGITAL SCREENING BILATERAL    Standing Status: Future     Number of Occurrences:      Standing Expiration Date: 07/18/2015    Order Specific Question:  Reason for Exam (SYMPTOM  OR DIAGNOSIS REQUIRED)    Answer:  early BCA in family    Order Specific Question:  Is the patient pregnant?    Answer:  No    Order Specific Question:  Preferred imaging location?    Answer:  [facility]  . US Transvaginal Non-OB    Standing Status: Future     Number of Occurrences:      Standing Expiration Date: 07/18/2015    Order Specific Question:  Reason for Exam (SYMPTOM  OR DIAGNOSIS REQUIRED)    Answer:  hx of fibroids    Order Specific Question:  Preferred imaging location?    Answer:  [facility]  . HIV antibody (with reflex)  . Hepatitis B surface antigen  . RPR  . Hepatitis C antibody  . Ambulatory referral to Nephrology    Referral Priority:  Routine    Referral Type:  Consultation    Referral Reason:  Specialty Services Required    Requested Specialty:  Nephrology    Number  of Visits Requested:  1  . Ambulatory referral to Podiatry    Referral Priority:  Routine    Referral Type:  Consultation    Referral Reason:  Specialty Services Required    Requested Specialty:  Podiatry    Number of Visits Requested:  1  . Ambulatory referral to Internal Medicine    Referral Priority:  Routine    Referral Type:  Consultation    Referral Reason:  Specialty Services Required    Requested Specialty:  Internal Medicine    Number of Visits Requested:  1    Possible management options include: uterine fibroid embolization, chantix/wellbutrin.

## 2014-05-19 LAB — PAP IG AND HPV HIGH-RISK: HPV DNA HIGH RISK: NOT DETECTED

## 2014-05-19 LAB — HIV ANTIBODY (ROUTINE TESTING W REFLEX): HIV: NONREACTIVE

## 2014-05-19 LAB — HEPATITIS C ANTIBODY: HCV AB: NEGATIVE

## 2014-05-19 LAB — HEPATITIS B SURFACE ANTIGEN: Hepatitis B Surface Ag: NEGATIVE

## 2014-05-19 LAB — RPR

## 2014-05-20 ENCOUNTER — Telehealth: Payer: Self-pay

## 2014-05-20 ENCOUNTER — Other Ambulatory Visit: Payer: Self-pay | Admitting: Certified Nurse Midwife

## 2014-05-20 DIAGNOSIS — N946 Dysmenorrhea, unspecified: Secondary | ICD-10-CM

## 2014-05-20 NOTE — Telephone Encounter (Signed)
PATIENT HAS APPT MONDAY 05/23/14 AT 11AM

## 2014-05-21 LAB — SURESWAB, VAGINOSIS/VAGINITIS PLUS
ATOPOBIUM VAGINAE: 6.4 Log (cells/mL)
C. ALBICANS, DNA: NOT DETECTED
C. TROPICALIS, DNA: NOT DETECTED
C. glabrata, DNA: NOT DETECTED
C. parapsilosis, DNA: NOT DETECTED
C. trachomatis RNA, TMA: NOT DETECTED
GARDNERELLA VAGINALIS: 7 Log (cells/mL)
LACTOBACILLUS SPECIES: NOT DETECTED Log (cells/mL)
MEGASPHAERA SPECIES: 7.3 Log (cells/mL)
N. GONORRHOEAE RNA, TMA: NOT DETECTED
T. vaginalis RNA, QL TMA: NOT DETECTED

## 2014-05-23 ENCOUNTER — Other Ambulatory Visit: Payer: Self-pay | Admitting: Certified Nurse Midwife

## 2014-05-23 ENCOUNTER — Telehealth: Payer: Self-pay

## 2014-05-23 ENCOUNTER — Ambulatory Visit (HOSPITAL_COMMUNITY)
Admission: RE | Admit: 2014-05-23 | Discharge: 2014-05-23 | Disposition: A | Discharge status: Home / Self Care | Payer: BLUE CROSS/BLUE SHIELD | Source: Ambulatory Visit | Attending: Certified Nurse Midwife | Admitting: Certified Nurse Midwife

## 2014-05-23 DIAGNOSIS — Z803 Family history of malignant neoplasm of breast: Secondary | ICD-10-CM

## 2014-05-23 DIAGNOSIS — Z1231 Encounter for screening mammogram for malignant neoplasm of breast: Secondary | ICD-10-CM | POA: Insufficient documentation

## 2014-05-23 DIAGNOSIS — Z01419 Encounter for gynecological examination (general) (routine) without abnormal findings: Secondary | ICD-10-CM

## 2014-05-23 NOTE — Telephone Encounter (Signed)
Patient has referrals scheduled with [place], [place] for mammogram, [facility] for ultrasound and only waiting on urology/nephrology - will call patient when I get that one

## 2014-05-24 ENCOUNTER — Telehealth: Payer: Self-pay

## 2014-05-24 NOTE — Telephone Encounter (Signed)
patient sch with [facility] on 06/20/14 at 8:30am - left vm for patient - she has all her referral appts sch

## 2014-05-25 ENCOUNTER — Ambulatory Visit (HOSPITAL_COMMUNITY): Payer: Medicaid Other

## 2014-06-01 ENCOUNTER — Ambulatory Visit (INDEPENDENT_AMBULATORY_CARE_PROVIDER_SITE_OTHER): Payer: BLUE CROSS/BLUE SHIELD

## 2014-06-01 ENCOUNTER — Other Ambulatory Visit: Payer: Self-pay | Admitting: Certified Nurse Midwife

## 2014-06-01 DIAGNOSIS — N946 Dysmenorrhea, unspecified: Secondary | ICD-10-CM

## 2014-06-01 DIAGNOSIS — Z803 Family history of malignant neoplasm of breast: Secondary | ICD-10-CM | POA: Diagnosis not present

## 2014-06-02 ENCOUNTER — Ambulatory Visit: Payer: BLUE CROSS/BLUE SHIELD | Admitting: Podiatry

## 2014-06-03 ENCOUNTER — Other Ambulatory Visit: Payer: Self-pay | Admitting: *Deleted

## 2014-06-03 DIAGNOSIS — B9689 Other specified bacterial agents as the cause of diseases classified elsewhere: Secondary | ICD-10-CM

## 2014-06-03 DIAGNOSIS — N760 Acute vaginitis: Principal | ICD-10-CM

## 2014-06-03 MED ORDER — METRONIDAZOLE 500 MG PO TABS: 500.0000 mg | ORAL_TABLET | Freq: Two times a day (BID) | ORAL | Status: DC

## 2014-06-15 ENCOUNTER — Ambulatory Visit: Payer: BLUE CROSS/BLUE SHIELD | Admitting: Certified Nurse Midwife

## 2014-06-21 ENCOUNTER — Ambulatory Visit: Payer: BLUE CROSS/BLUE SHIELD | Admitting: Family

## 2014-07-07 ENCOUNTER — Ambulatory Visit (INDEPENDENT_AMBULATORY_CARE_PROVIDER_SITE_OTHER): Payer: BLUE CROSS/BLUE SHIELD | Admitting: Family

## 2014-07-07 ENCOUNTER — Encounter: Payer: Self-pay | Admitting: Family

## 2014-07-07 VITALS — BP 118/88 | HR 82 | Temp 99.0°F | Resp 18 | Ht 67.5 in | Wt 162.0 lb

## 2014-07-07 DIAGNOSIS — S0180XA Unspecified open wound of other part of head, initial encounter: Secondary | ICD-10-CM | POA: Diagnosis not present

## 2014-07-07 DIAGNOSIS — Z0000 Encounter for general adult medical examination without abnormal findings: Secondary | ICD-10-CM | POA: Insufficient documentation

## 2014-07-07 MED ORDER — SULFAMETHOXAZOLE-TRIMETHOPRIM 800-160 MG PO TABS: 1.0000 | ORAL_TABLET | Freq: Two times a day (BID) | ORAL | Status: DC

## 2014-07-07 NOTE — Assessment & Plan Note (Signed)
Small ulceration noted anterior to right ear. Start Bactrim to cover for MRSA. Refer to dermatology for further workup. Follow-up if symptoms worsen or fail to improve.

## 2014-07-07 NOTE — Assessment & Plan Note (Signed)
1) Anticipatory Guidance: Discussed importance of wearing a seatbelt while driving and not texting while driving; changing batteries in smoke detector at least once annually; wearing suntan lotion when outside; eating a balanced and moderate diet; getting physical activity at least 30 minutes per day.  2) Immunizations / Screenings / Labs:  All immunizations are up-to-date per recommendations. All screenings are up-to-date per recommendations. Obtain CBC, BMET, Lipid profile, iron and TSH and fasting.   Overall well exam. Patient's current cardiovascular risk factors include a sedentary lifestyle and tobacco use. Goal will be to increase physical activity to 30 minutes of moderate intensity most days of the week. 4 tobacco use discussed briefly methods of quitting tobacco use including gums, patches, and medications. Patient is currently contemplating decreasing her smoking. Continue other current healthy lifestyle behaviors. Follow-up prevention exam in one year. Follow up office visit pending blood work.

## 2014-07-07 NOTE — Progress Notes (Signed)
Pre visit review using our clinic review tool, if applicable. No additional management support is needed unless otherwise documented below in the visit note. 

## 2014-07-07 NOTE — Progress Notes (Signed)
Subjective:    Patient ID: Tiffany [name], female    DOB: November 17, 1975, 39 y.o.   MRN: [id number]  Chief Complaint  Patient presents with  . Establish Care    states that she has a hole right beside her ear that has white stuff that comes out that smells, also wants blood work done    HPI:  Tiffany [name] is a 39 y.o. female who presents today for an annual wellness visit.   1) Health Maintenance -   Diet - Averages about 2 meals per day. Consists of fruits, vegetables, fast food; Caffeine about 2-3 cups per day  Exercise - No structured exercise program.    2) Preventative Exams / Immunizations:  Dental -- Up to date  Vision -- Up to date   Health Maintenance  Topic Date Due  . INFLUENZA VACCINE  10/10/2014  . PAP SMEAR  05/17/2017  . TETANUS/TDAP  07/07/2023  . HIV Screening  Completed    There is no immunization history on file for this patient.  No Known Allergies  No current outpatient prescriptions on file prior to visit.   No current facility-administered medications on file prior to visit.    Past Medical History  Diagnosis Date  . Fibroids   . Dysmenorrhea     Past Surgical History  Procedure Laterality Date  . Cesarean section    . Breast biopsy      Family History  Problem Relation Age of Onset  . Hypertension Mother   . Kidney disease Mother   . Diabetes Maternal Aunt   . Cancer Maternal Aunt     breast  . Hypertension Maternal Grandmother     History   Social History  . Marital Status: Single    Spouse Name: N/A  . Number of Children: 1  . Years of Education: 16   Occupational History  . Sales Associate    Social History Main Topics  . Smoking status: Current Every Day Smoker -- 0.50 packs/day for 10 years    Types: Cigarettes  . Smokeless tobacco: Never Used  . Alcohol Use: 1.8 oz/week    3 Shots of liquor per week  . Drug Use: No  . Sexual Activity:    Partners: Male    Birth Control/ Protection: Pill   Other  Topics Concern  . Not on file   Social History Narrative   Fun: Watching tv   Denies any religious beliefs effecting health care.      Review of Systems  Constitutional: Denies fever, chills, fatigue, or significant weight gain/loss. HENT: Head: Denies headache or neck pain Ears: Denies changes in hearing, ringing in ears, earache, drainage Right ear - Associated symptom of a hole located anterior to her right ear has been there for at least 5 years and recently noted that it is getting bigger. Notes that when she squeezes it some white discharge is noted and has a foul odor to it. Denies any modifying factors that make it better or worse. Denies any pain. Nose: Denies discharge, stuffiness, itching, nosebleed, sinus pain Throat: Denies sore throat, hoarseness, dry mouth, sores, thrush Eyes: Denies loss/changes in vision, pain, redness, blurry/double vision, flashing lights Cardiovascular: Denies chest pain/discomfort, tightness, palpitations, shortness of breath with activity, difficulty lying down, swelling, sudden awakening with shortness of breath Respiratory: Denies shortness of breath, cough, sputum production, wheezing Gastrointestinal: Denies dysphasia, heartburn, change in appetite, nausea, change in bowel habits, rectal bleeding, constipation, diarrhea, yellow skin or eyes Genitourinary:  Denies frequency, urgency, burning/pain, blood in urine, incontinence, change in urinary strength. Musculoskeletal: Denies muscle/joint pain, stiffness, back pain, redness or swelling of joints, trauma Skin: Denies rashes, lumps, itching, dryness, color changes, or hair/nail changes Neurological: Denies dizziness, fainting, seizures, weakness, numbness, tingling, tremor Psychiatric - Denies nervousness, stress, depression or memory loss Endocrine: Denies heat or cold intolerance, sweating, frequent urination, excessive thirst, changes in appetite Hematologic: Denies ease of bruising or  bleeding     Objective:    BP 118/88 mmHg  Pulse 82  Temp(Src) 99 F (37.2 C) (Oral)  Resp 18  Ht 5' 7.5" (1.715 m)  Wt 162 lb (73.483 kg)  BMI 24.98 kg/m2  SpO2 97% Nursing note and vital signs reviewed.  Physical Exam  Constitutional: She is oriented to person, place, and time. She appears well-developed and well-nourished.  HENT:  Head: Normocephalic.  Right Ear: Hearing, tympanic membrane, external ear and ear canal normal.  Left Ear: Hearing, tympanic membrane, external ear and ear canal normal.  Nose: Nose normal.  Mouth/Throat: Uvula is midline, oropharynx is clear and moist and mucous membranes are normal.  Approximately 1/4 cm irregularly shaped ulceration with no erythema noted anterior to right ear. Discharge upon pressure appears white with foul odor.  Eyes: Conjunctivae and EOM are normal. Pupils are equal, round, and reactive to light.  Neck: Neck supple. No JVD present. No tracheal deviation present. No thyromegaly present.  Cardiovascular: Normal rate, regular rhythm, normal heart sounds and intact distal pulses.   Pulmonary/Chest: Effort normal and breath sounds normal.  Abdominal: Soft. Bowel sounds are normal. She exhibits no distension and no mass. There is no tenderness. There is no rebound and no guarding.  Musculoskeletal: Normal range of motion. She exhibits no edema or tenderness.  Lymphadenopathy:    She has no cervical adenopathy.  Neurological: She is alert and oriented to person, place, and time. She has normal reflexes. No cranial nerve deficit. She exhibits normal muscle tone. Coordination normal.  Skin: Skin is warm and dry.  Psychiatric: She has a normal mood and affect. Her behavior is normal. Judgment and thought content normal.       Assessment & Plan:

## 2014-07-07 NOTE — Progress Notes (Deleted)
   Subjective:    Patient ID: Bing Neighbors, female    DOB: 01/20/1976, 39 y.o.   MRN: [id number]  Chief Complaint  Patient presents with  . Establish Care    states that she has a hole right beside her ear that has white stuff that comes out that smells, also wants blood work done    HPI:  SHIRAH ROSEMAN is a 39 y.o. female with a PMH of dysmenorrhea, irregular menstrual cycle and tobacco use who presents today for an office visit to establish care.   1) Right ear - Associated symptom of a hole located anterior to her right ear has been there for at least 5 years and recently noted that it is getting bigger. Notes that when she squeezes it some white discharge is noted and has a foul odor to it. Denies any modifying factors that make it better or worse. Denies any pain.  Review of Systems    Objective:    BP 118/88 mmHg  Pulse 82  Temp(Src) 99 F (37.2 C) (Oral)  Resp 18  Ht 5' 7.5" (1.715 m)  Wt 162 lb (73.483 kg)  BMI 24.98 kg/m2  SpO2 97%  Nursing note and vital signs reviewed.  Physical Exam     Assessment & Plan:

## 2014-07-07 NOTE — Patient Instructions (Addendum)
Thank you for choosing [facility].  Summary/Instructions:  Your prescription(s) have been submitted to your pharmacy or been printed and provided for you. Please take as directed and contact our office if you believe you are having problem(s) with the medication(s) or have any questions.  Please stop by the lab on the basement level of the building for your blood work. Your results will be released to [facility] (or called to you) after review, usually within 72 hours after test completion. If any changes need to be made, you will be notified at that same time.  Referrals have been made during this visit. You should expect to hear back from our schedulers in about 7-10 days in regards to establishing an appointment with the specialists we discussed.   If your symptoms worsen or fail to improve, please contact our office for further instruction, or in case of emergency go directly to the emergency room at the closest medical facility.   Health Maintenance Adopting a healthy lifestyle and getting preventive care can go a long way to promote health and wellness. Talk with your health care provider about what schedule of regular examinations is right for you. This is a good chance for you to check in with your provider about disease prevention and staying healthy. In between checkups, there are plenty of things you can do on your own. Experts have done a lot of research about which lifestyle changes and preventive measures are most likely to keep you healthy. Ask your health care provider for more information. WEIGHT AND DIET  Eat a healthy diet  Be sure to include plenty of vegetables, fruits, low-fat dairy products, and lean protein.  Do not eat a lot of foods high in solid fats, added sugars, or salt.  Get regular exercise. This is one of the most important things you can do for your health.  Most adults should exercise for at least 150 minutes each week. The exercise should increase your  heart rate and make you sweat (moderate-intensity exercise).  Most adults should also do strengthening exercises at least twice a week. This is in addition to the moderate-intensity exercise.  Maintain a healthy weight  Body mass index (BMI) is a measurement that can be used to identify possible weight problems. It estimates body fat based on height and weight. Your health care provider can help determine your BMI and help you achieve or maintain a healthy weight.  For females 5 years of age and older:   A BMI below 18.5 is considered underweight.  A BMI of 18.5 to 24.9 is normal.  A BMI of 25 to 29.9 is considered overweight.  A BMI of 30 and above is considered obese.  Watch levels of cholesterol and blood lipids  You should start having your blood tested for lipids and cholesterol at 37 years of age, then have this test every 5 years.  You may need to have your cholesterol levels checked more often if:  Your lipid or cholesterol levels are high.  You are older than 37 years of age.  You are at high risk for heart disease.  CANCER SCREENING   Lung Cancer  Lung cancer screening is recommended for adults 57-60 years old who are at high risk for lung cancer because of a history of smoking.  A yearly low-dose CT scan of the lungs is recommended for people who:  Currently smoke.  Have quit within the past 15 years.  Have at least a 30-pack-year history of smoking.  A pack year is smoking an average of one pack of cigarettes a day for 1 year.  Yearly screening should continue until it has been 15 years since you quit.  Yearly screening should stop if you develop a health problem that would prevent you from having lung cancer treatment.  Breast Cancer  Practice breast self-awareness. This means understanding how your breasts normally appear and feel.  It also means doing regular breast self-exams. Let your health care provider know about any changes, no matter how  small.  If you are in your 20s or 30s, you should have a clinical breast exam (CBE) by a health care provider every 1-3 years as part of a regular health exam.  If you are 61 or older, have a CBE every year. Also consider having a breast X-ray (mammogram) every year.  If you have a family history of breast cancer, talk to your health care provider about genetic screening.  If you are at high risk for breast cancer, talk to your health care provider about having an MRI and a mammogram every year.  Breast cancer gene (BRCA) assessment is recommended for women who have family members with BRCA-related cancers. BRCA-related cancers include:  Breast.  Ovarian.  Tubal.  Peritoneal cancers.  Results of the assessment will determine the need for genetic counseling and BRCA1 and BRCA2 testing. Cervical Cancer Routine pelvic examinations to screen for cervical cancer are no longer recommended for nonpregnant women who are considered low risk for cancer of the pelvic organs (ovaries, uterus, and vagina) and who do not have symptoms. A pelvic examination may be necessary if you have symptoms including those associated with pelvic infections. Ask your health care provider if a screening pelvic exam is right for you.   The Pap test is the screening test for cervical cancer for women who are considered at risk.  If you had a hysterectomy for a problem that was not cancer or a condition that could lead to cancer, then you no longer need Pap tests.  If you are older than 65 years, and you have had normal Pap tests for the past 10 years, you no longer need to have Pap tests.  If you have had past treatment for cervical cancer or a condition that could lead to cancer, you need Pap tests and screening for cancer for at least 20 years after your treatment.  If you no longer get a Pap test, assess your risk factors if they change (such as having a new sexual partner). This can affect whether you should  start being screened again.  Some women have medical problems that increase their chance of getting cervical cancer. If this is the case for you, your health care provider may recommend more frequent screening and Pap tests.  The human papillomavirus (HPV) test is another test that may be used for cervical cancer screening. The HPV test looks for the virus that can cause cell changes in the cervix. The cells collected during the Pap test can be tested for HPV.  The HPV test can be used to screen women 87 years of age and older. Getting tested for HPV can extend the interval between normal Pap tests from three to five years.  An HPV test also should be used to screen women of any age who have unclear Pap test results.  After 37 years of age, women should have HPV testing as often as Pap tests.  Colorectal Cancer  This type of cancer can be  detected and often prevented.  Routine colorectal cancer screening usually begins at 37 years of age and continues through 37 years of age.  Your health care provider may recommend screening at an earlier age if you have risk factors for colon cancer.  Your health care provider may also recommend using home test kits to check for hidden blood in the stool.  A small camera at the end of a tube can be used to examine your colon directly (sigmoidoscopy or colonoscopy). This is done to check for the earliest forms of colorectal cancer.  Routine screening usually begins at age 12.  Direct examination of the colon should be repeated every 5-10 years through 37 years of age. However, you may need to be screened more often if early forms of precancerous polyps or small growths are found. Skin Cancer  Check your skin from head to toe regularly.  Tell your health care provider about any new moles or changes in moles, especially if there is a change in a mole's shape or color.  Also tell your health care provider if you have a mole that is larger than the size  of a pencil eraser.  Always use sunscreen. Apply sunscreen liberally and repeatedly throughout the day.  Protect yourself by wearing long sleeves, pants, a wide-brimmed hat, and sunglasses whenever you are outside. HEART DISEASE, DIABETES, AND HIGH BLOOD PRESSURE   Have your blood pressure checked at least every 1-2 years. High blood pressure causes heart disease and increases the risk of stroke.  If you are between 52 years and 34 years old, ask your health care provider if you should take aspirin to prevent strokes.  Have regular diabetes screenings. This involves taking a blood sample to check your fasting blood sugar level.  If you are at a normal weight and have a low risk for diabetes, have this test once every three years after 37 years of age.  If you are overweight and have a high risk for diabetes, consider being tested at a younger age or more often. PREVENTING INFECTION  Hepatitis B  If you have a higher risk for hepatitis B, you should be screened for this virus. You are considered at high risk for hepatitis B if:  You were born in a country where hepatitis B is common. Ask your health care provider which countries are considered high risk.  Your parents were born in a high-risk country, and you have not been immunized against hepatitis B (hepatitis B vaccine).  You have HIV or AIDS.  You use needles to inject street drugs.  You live with someone who has hepatitis B.  You have had sex with someone who has hepatitis B.  You get hemodialysis treatment.  You take certain medicines for conditions, including cancer, organ transplantation, and autoimmune conditions. Hepatitis C  Blood testing is recommended for:  Everyone born from [birth year] through 1963.  Anyone with known risk factors for hepatitis C. Sexually transmitted infections (STIs)  You should be screened for sexually transmitted infections (STIs) including gonorrhea and chlamydia if:  You are sexually  active and are younger than 37 years of age.  You are older than 37 years of age and your health care provider tells you that you are at risk for this type of infection.  Your sexual activity has changed since you were last screened and you are at an increased risk for chlamydia or gonorrhea. Ask your health care provider if you are at risk.  If you do  not have HIV, but are at risk, it may be recommended that you take a prescription medicine daily to prevent HIV infection. This is called pre-exposure prophylaxis (PrEP). You are considered at risk if:  You are sexually active and do not regularly use condoms or know the HIV status of your partner(s).  You take drugs by injection.  You are sexually active with a partner who has HIV. Talk with your health care provider about whether you are at high risk of being infected with HIV. If you choose to begin PrEP, you should first be tested for HIV. You should then be tested every 3 months for as long as you are taking PrEP.  PREGNANCY   If you are premenopausal and you may become pregnant, ask your health care provider about preconception counseling.  If you may become pregnant, take 400 to 800 micrograms (mcg) of folic acid every day.  If you want to prevent pregnancy, talk to your health care provider about birth control (contraception). OSTEOPOROSIS AND MENOPAUSE   Osteoporosis is a disease in which the bones lose minerals and strength with aging. This can result in serious bone fractures. Your risk for osteoporosis can be identified using a bone density scan.  If you are 65 years of age or older, or if you are at risk for osteoporosis and fractures, ask your health care provider if you should be screened.  Ask your health care provider whether you should take a calcium or vitamin D supplement to lower your risk for osteoporosis.  Menopause may have certain physical symptoms and risks.  Hormone replacement therapy may reduce some of these  symptoms and risks. Talk to your health care provider about whether hormone replacement therapy is right for you.  HOME CARE INSTRUCTIONS   Schedule regular health, dental, and eye exams.  Stay current with your immunizations.   Do not use any tobacco products including cigarettes, chewing tobacco, or electronic cigarettes.  If you are pregnant, do not drink alcohol.  If you are breastfeeding, limit how much and how often you drink alcohol.  Limit alcohol intake to no more than 1 drink per day for nonpregnant women. One drink equals 12 ounces of beer, 5 ounces of wine, or 1 ounces of hard liquor.  Do not use street drugs.  Do not share needles.  Ask your health care provider for help if you need support or information about quitting drugs.  Tell your health care provider if you often feel depressed.  Tell your health care provider if you have ever been abused or do not feel safe at home. Document Released: 09/10/2010 Document Revised: 07/12/2013 Document Reviewed: 01/27/2013 ExitCare Patient Information 2015 [employer]. This information is not intended to replace advice given to you by your health care provider. Make sure you discuss any questions you have with your health care provider.   

## 2014-10-18 ENCOUNTER — Other Ambulatory Visit: Payer: Self-pay | Admitting: Obstetrics

## 2014-10-22 ENCOUNTER — Other Ambulatory Visit: Payer: Self-pay | Admitting: Obstetrics

## 2014-10-27 ENCOUNTER — Other Ambulatory Visit: Payer: Self-pay | Admitting: *Deleted

## 2014-10-27 ENCOUNTER — Telehealth: Payer: Self-pay | Admitting: *Deleted

## 2014-10-27 DIAGNOSIS — B9689 Other specified bacterial agents as the cause of diseases classified elsewhere: Secondary | ICD-10-CM

## 2014-10-27 DIAGNOSIS — N760 Acute vaginitis: Principal | ICD-10-CM

## 2014-10-27 MED ORDER — METRONIDAZOLE 500 MG PO TABS: 500.0000 mg | ORAL_TABLET | Freq: Two times a day (BID) | ORAL | Status: DC

## 2014-10-27 NOTE — Telephone Encounter (Signed)
Patient is having symptoms of BV. Patient is requesting a prescription. Patient states she is having a discharge with odor and slight itching. Per nursing protocol prescription sent to the pharmacy for treatment.

## 2014-11-12 ENCOUNTER — Other Ambulatory Visit: Payer: Self-pay | Admitting: Obstetrics

## 2014-11-17 ENCOUNTER — Other Ambulatory Visit: Payer: Self-pay | Admitting: Obstetrics

## 2015-01-23 ENCOUNTER — Other Ambulatory Visit: Payer: Self-pay | Admitting: Obstetrics

## 2015-02-22 ENCOUNTER — Other Ambulatory Visit: Payer: Self-pay | Admitting: Obstetrics

## 2015-02-23 ENCOUNTER — Other Ambulatory Visit: Payer: Self-pay | Admitting: Obstetrics

## 2015-03-27 ENCOUNTER — Telehealth: Payer: Self-pay | Admitting: *Deleted

## 2015-03-27 DIAGNOSIS — Z3041 Encounter for surveillance of contraceptive pills: Secondary | ICD-10-CM

## 2015-03-27 MED ORDER — NORETHINDRONE 0.35 MG PO TABS: 1.0000 | ORAL_TABLET | Freq: Every day | ORAL | Status: DC

## 2015-03-27 NOTE — Telephone Encounter (Signed)
Patient is calling for a refill of her birth control. Patient is due birth control 05/2015. Rx sent to pharmacy.

## 2015-06-23 ENCOUNTER — Other Ambulatory Visit: Payer: Self-pay | Admitting: Obstetrics

## 2015-06-28 ENCOUNTER — Telehealth: Payer: Self-pay | Admitting: *Deleted

## 2015-06-28 DIAGNOSIS — Z3041 Encounter for surveillance of contraceptive pills: Secondary | ICD-10-CM

## 2015-06-28 MED ORDER — NORETHINDRONE 0.35 MG PO TABS: ORAL_TABLET | ORAL | Status: DC

## 2015-06-28 NOTE — Telephone Encounter (Signed)
Patient need refill of her POP- she lives out of town and need to go ahead and get the refill.

## 2015-07-05 ENCOUNTER — Telehealth: Payer: Self-pay | Admitting: *Deleted

## 2015-07-05 NOTE — Telephone Encounter (Signed)
Patient just got into town and she does not have her POP with her. She would like [place] to give her a sample if we have it. Explained to patient we do not have samples- but offered to call in a substitute pack at [facility] for her. She is agreeable and thanks the office for helping her as she is commuting [place] now.

## 2015-09-26 ENCOUNTER — Encounter: Payer: Self-pay | Admitting: Certified Nurse Midwife

## 2015-09-26 ENCOUNTER — Encounter: Payer: Self-pay | Admitting: Gastroenterology

## 2015-09-26 ENCOUNTER — Ambulatory Visit (INDEPENDENT_AMBULATORY_CARE_PROVIDER_SITE_OTHER): Payer: Managed Care, Other (non HMO) | Admitting: Certified Nurse Midwife

## 2015-09-26 VITALS — BP 137/87 | HR 81 | Temp 98.4°F | Wt 161.0 lb

## 2015-09-26 DIAGNOSIS — Z01419 Encounter for gynecological examination (general) (routine) without abnormal findings: Secondary | ICD-10-CM | POA: Diagnosis not present

## 2015-09-26 DIAGNOSIS — R61 Generalized hyperhidrosis: Secondary | ICD-10-CM | POA: Insufficient documentation

## 2015-09-26 DIAGNOSIS — L732 Hidradenitis suppurativa: Secondary | ICD-10-CM

## 2015-09-26 DIAGNOSIS — Z800 Family history of malignant neoplasm of digestive organs: Secondary | ICD-10-CM | POA: Insufficient documentation

## 2015-09-26 DIAGNOSIS — Z113 Encounter for screening for infections with a predominantly sexual mode of transmission: Secondary | ICD-10-CM

## 2015-09-26 DIAGNOSIS — Z3041 Encounter for surveillance of contraceptive pills: Secondary | ICD-10-CM

## 2015-09-26 DIAGNOSIS — L739 Follicular disorder, unspecified: Secondary | ICD-10-CM | POA: Insufficient documentation

## 2015-09-26 MED ORDER — NORETHINDRONE 0.35 MG PO TABS: ORAL_TABLET | ORAL | Status: DC

## 2015-09-26 NOTE — Progress Notes (Signed)
Patient ID: Bing Neighbors, female   DOB: September 30, 1975, 40 y.o.   MRN: [id number]   Subjective:        Tiffany [name] is a 40 y.o. female here for a routine exam.  Current complaints:   Reports having a boil in upper gluteal cleft, since October 2016.  Has not had this looked at yet.  Has used Neosporin & Vaseline without relief.  Bloody discharge from boil.  Puts toilet tissue in underwear if the discharge is excessive.   Reports being sexually active.  Requesting PAP & STD screening.  Reports doing self breast exams quarterly.  Reports Maternal Aunt with colon cancer with mets to breast.  Personal health questionnaire:  Is patient [name], have a family history of breast and/or ovarian cancer: no Is there a family history of uterine cancer diagnosed at age < 68, gastrointestinal cancer, urinary tract cancer, family member who is a Field seismologist syndrome-associated carrier: no Is the patient overweight and hypertensive, family history of diabetes, personal history of gestational diabetes, preeclampsia or PCOS: no Is patient over 59, have PCOS,  family history of premature CHD under age 56, diabetes, smoke, have hypertension or peripheral artery disease:  no At any time, has a partner hit, kicked or otherwise hurt or frightened you?: no Over the past 2 weeks, have you felt down, depressed or hopeless?: no Over the past 2 weeks, have you felt little interest or pleasure in doing things?:no   Gynecologic History No LMP recorded. Patient is not currently having periods (Reason: Oral contraceptives). Contraception: OCP (estrogen/progesterone) Last Pap: 05-18-2014. Results were: normal Last mammogram: 05-23-2014. Results were: normal  Obstetric History OB History  Gravida Para Term Preterm AB SAB TAB Ectopic Multiple Living  2 1  1 1 1         # Outcome Date GA Lbr Len/2nd Weight Sex Delivery Anes PTL Lv  2 SAB 08/25/06 [redacted]w[redacted]d         1 Preterm 03/11/94 [redacted]w[redacted]d  1 lb 5 oz (0.595 kg)     FD       Past Medical History  Diagnosis Date  . Fibroids   . Dysmenorrhea     Past Surgical History  Procedure Laterality Date  . Cesarean section    . Breast biopsy       Current outpatient prescriptions:  .  Biotin 5 MG TABS, Take by mouth., Disp: , Rfl:  .  norethindrone ([name]) 0.35 MG tablet, TAKE 1 TABLET(0.35 MG) BY MOUTH DAILY, Disp: 3 Package, Rfl: 5 .  vitamin B-12 (CYANOCOBALAMIN) 1000 MCG tablet, Take 1,000 mcg by mouth daily. Reported on 09/26/2015, Disp: , Rfl:  No Known Allergies  Social History  Substance Use Topics  . Smoking status: Current Every Day Smoker -- 0.50 packs/day for 10 years    Types: Cigarettes  . Smokeless tobacco: Never Used  . Alcohol Use: 1.8 oz/week    3 Shots of liquor per week     Comment: couple days out of the week    Family History  Problem Relation Age of Onset  . Hypertension Mother   . Kidney disease Mother   . Diabetes Maternal Aunt   . Cancer Maternal Aunt     breast  . Hypertension Maternal Grandmother       Review of Systems  Constitutional: negative for fatigue and weight loss Respiratory: negative for cough and wheezing Cardiovascular: negative for chest pain, fatigue and palpitations Gastrointestinal: negative for abdominal pain and change in bowel habits  Musculoskeletal:negative for myalgias Neurological: negative for gait problems and tremors Behavioral/Psych: negative for abusive relationship, depression Endocrine: negative for temperature intolerance   Genitourinary:negative for abnormal menstrual periods, genital lesions, hot flashes, sexual problems and vaginal discharge Integument/breast: negative for breast lump, breast tenderness, nipple discharge and skin lesion(s)    Objective:       BP 137/87 mmHg  Pulse 81  Temp(Src) 98.4 F (36.9 C)  Wt 161 lb (73.029 kg) General:   alert  Skin:   no rash or abnormalities  Lungs:   clear to auscultation bilaterally  Heart:   regular rate and rhythm, S1, S2  normal, no murmur, click, rub or gallop  Breasts:   normal without suspicious masses, skin or nipple changes or axillary nodes  Abdomen:  normal findings: no organomegaly, soft, non-tender and no hernia  Pelvis:  External genitalia: normal general appearance Urinary system: urethral meatus normal and bladder without fullness, nontender Vaginal: normal without tenderness, induration or masses Cervix: normal appearance Adnexa: normal bimanual exam Uterus: anteverted and non-tender, normal size   Lab Review Urine pregnancy test Labs reviewed yes Radiologic studies reviewed yes  50% of 30 min visit spent on counseling and coordination of care.   Assessment:    Healthy female exam.   Hydradanitis, 0.41mm x 0.3 mm x 1 cm.  Folliculitis Excessive sweating STD Screening. Family history of colon cancer. Plan:  Dermatology consult. GI consult, for early colonoscopy.  Education reviewed: depression evaluation, low fat, low cholesterol diet, safe sex/STD prevention, self breast exams and weight bearing exercise. Contraception: OCP (estrogen/progesterone). Follow up in: 1 year.   Meds ordered this encounter  Medications  . Biotin 5 MG TABS    Sig: Take by mouth.  . DISCONTD: norethindrone ([name]) 0.35 MG tablet    Sig: TAKE 1 TABLET(0.35 MG) BY MOUTH DAILY    Dispense:  28 tablet    Refill:  5  . norethindrone ([name]) 0.35 MG tablet    Sig: TAKE 1 TABLET(0.35 MG) BY MOUTH DAILY    Dispense:  3 Package    Refill:  5   Orders Placed This Encounter  Procedures  . Hepatitis B surface antigen  . RPR  . Hepatitis C antibody  . HIV antibody   Need to obtain previous records Possible management options include: dermatology consult for boil, change of contraception if patient desires. Follow up as needed.

## 2015-09-27 LAB — HEPATITIS C ANTIBODY: Hep C Virus Ab: <0.1 s/co ratio (ref 0.0–0.9)

## 2015-09-27 LAB — RPR: RPR Ser Ql: NONREACTIVE

## 2015-09-27 LAB — HEPATITIS B SURFACE ANTIGEN: Hepatitis B Surface Ag: NEGATIVE

## 2015-09-27 LAB — HIV ANTIBODY (ROUTINE TESTING W REFLEX): HIV Screen 4th Generation wRfx: NONREACTIVE

## 2015-09-29 LAB — PAP IG AND HPV HIGH-RISK
HPV, HIGH-RISK: NEGATIVE
PAP Smear Comment: 0

## 2015-09-30 LAB — NUSWAB VG+, CANDIDA 6SP
Atopobium vaginae: HIGH Score — AB
BVAB 2: HIGH Score — AB
CANDIDA ALBICANS, NAA: NEGATIVE
CANDIDA GLABRATA, NAA: NEGATIVE
CANDIDA LUSITANIAE, NAA: NEGATIVE
CANDIDA PARAPSILOSIS, NAA: NEGATIVE
CANDIDA TROPICALIS, NAA: NEGATIVE
Candida krusei, NAA: NEGATIVE
Chlamydia trachomatis, NAA: NEGATIVE
Megasphaera 1: HIGH Score — AB
NEISSERIA GONORRHOEAE, NAA: NEGATIVE
Trich vag by NAA: NEGATIVE

## 2015-10-03 ENCOUNTER — Other Ambulatory Visit: Payer: Self-pay | Admitting: Certified Nurse Midwife

## 2015-10-03 DIAGNOSIS — B9689 Other specified bacterial agents as the cause of diseases classified elsewhere: Secondary | ICD-10-CM

## 2015-10-03 DIAGNOSIS — N760 Acute vaginitis: Principal | ICD-10-CM

## 2015-10-03 MED ORDER — METRONIDAZOLE 500 MG PO TABS: 500.0000 mg | ORAL_TABLET | Freq: Two times a day (BID) | ORAL | 0 refills | Status: DC

## 2015-11-09 ENCOUNTER — Telehealth: Payer: Self-pay | Admitting: *Deleted

## 2015-11-09 NOTE — Telephone Encounter (Signed)
Refill for BCP transferred to [facility].

## 2015-12-04 ENCOUNTER — Ambulatory Visit: Payer: BLUE CROSS/BLUE SHIELD | Admitting: Gastroenterology

## 2016-01-16 ENCOUNTER — Other Ambulatory Visit: Payer: Self-pay | Admitting: Obstetrics

## 2016-01-16 DIAGNOSIS — Z3041 Encounter for surveillance of contraceptive pills: Secondary | ICD-10-CM

## 2016-01-18 ENCOUNTER — Other Ambulatory Visit: Payer: Self-pay | Admitting: Certified Nurse Midwife

## 2016-01-18 ENCOUNTER — Telehealth: Payer: Self-pay | Admitting: *Deleted

## 2016-01-18 DIAGNOSIS — Z3041 Encounter for surveillance of contraceptive pills: Secondary | ICD-10-CM

## 2016-01-18 MED ORDER — NORETHINDRONE 0.35 MG PO TABS: 1.0000 | ORAL_TABLET | Freq: Every day | ORAL | 1 refills | Status: DC

## 2016-01-18 NOTE — Telephone Encounter (Signed)
Patient is calling for a refill of her birth control. She had annual exam 10/2015 with normal pap.

## 2016-01-18 NOTE — Telephone Encounter (Signed)
Micronor sent to the pharmacy.

## 2016-03-25 ENCOUNTER — Other Ambulatory Visit: Payer: Self-pay | Admitting: Obstetrics

## 2016-03-25 DIAGNOSIS — Z3041 Encounter for surveillance of contraceptive pills: Secondary | ICD-10-CM

## 2016-05-02 ENCOUNTER — Ambulatory Visit: Payer: BLUE CROSS/BLUE SHIELD | Admitting: Obstetrics & Gynecology

## 2016-05-30 ENCOUNTER — Other Ambulatory Visit: Payer: Self-pay | Admitting: Obstetrics

## 2016-05-30 DIAGNOSIS — Z3041 Encounter for surveillance of contraceptive pills: Secondary | ICD-10-CM

## 2016-08-12 ENCOUNTER — Ambulatory Visit (INDEPENDENT_AMBULATORY_CARE_PROVIDER_SITE_OTHER): Payer: No Typology Code available for payment source | Admitting: Certified Nurse Midwife

## 2016-08-12 ENCOUNTER — Encounter: Payer: Self-pay | Admitting: Certified Nurse Midwife

## 2016-08-12 VITALS — BP 125/79 | HR 103 | Ht 67.5 in | Wt 182.3 lb

## 2016-08-12 DIAGNOSIS — N898 Other specified noninflammatory disorders of vagina: Secondary | ICD-10-CM

## 2016-08-12 DIAGNOSIS — Z01419 Encounter for gynecological examination (general) (routine) without abnormal findings: Secondary | ICD-10-CM

## 2016-08-12 DIAGNOSIS — Z113 Encounter for screening for infections with a predominantly sexual mode of transmission: Secondary | ICD-10-CM

## 2016-08-12 DIAGNOSIS — B373 Candidiasis of vulva and vagina: Secondary | ICD-10-CM

## 2016-08-12 DIAGNOSIS — Z3202 Encounter for pregnancy test, result negative: Secondary | ICD-10-CM

## 2016-08-12 DIAGNOSIS — Z124 Encounter for screening for malignant neoplasm of cervix: Secondary | ICD-10-CM | POA: Diagnosis not present

## 2016-08-12 DIAGNOSIS — N926 Irregular menstruation, unspecified: Secondary | ICD-10-CM

## 2016-08-12 DIAGNOSIS — B3731 Acute candidiasis of vulva and vagina: Secondary | ICD-10-CM

## 2016-08-12 LAB — POCT URINE PREGNANCY: Preg Test, Ur: NEGATIVE

## 2016-08-12 MED ORDER — FLUCONAZOLE 200 MG PO TABS: 200.0000 mg | ORAL_TABLET | Freq: Once | ORAL | 0 refills | Status: AC

## 2016-08-12 NOTE — Progress Notes (Signed)
Patient is in the office for annual exam, last pap 05-18-14, pt states she has started taking GRS Ultra cell defense, that she ordered online. Patient states that she has not been having periods while on the [facility] pills, but stopped 2 weeks ago and had a menstrual on 07-22-16.

## 2016-08-12 NOTE — Progress Notes (Signed)
Subjective:        Tiffany [name] is a 41 y.o. female here for a routine exam.  Current complaints: none, desires to stay on POP, has not had a period while on it.  Insurance changed.  Desires full exam with STD testing.   Is smoking a pack a day.   Does not want any more children.    Personal health questionnaire:  Is patient [name], have a family history of breast and/or ovarian cancer: yes Is there a family history of uterine cancer diagnosed at age < 55, gastrointestinal cancer, urinary tract cancer, family member who is a Field seismologist syndrome-associated carrier: yes Is the patient overweight and hypertensive, family history of diabetes, personal history of gestational diabetes, preeclampsia or PCOS: yes Is patient over 70, have PCOS,  family history of premature CHD under age 67, diabetes, smoke, have hypertension or peripheral artery disease:  no At any time, has a partner hit, kicked or otherwise hurt or frightened you?: no Over the past 2 weeks, have you felt down, depressed or hopeless?: no Over the past 2 weeks, have you felt little interest or pleasure in doing things?:no  Gynecologic History Patient's last menstrual period was 07/22/2016. Contraception: none Last Pap: 09/26/15. Results were: normal Last mammogram: 05/23/14. Results were: normal  Obstetric History OB History  Gravida Para Term Preterm AB Living  2 1   1 1     SAB TAB Ectopic Multiple Live Births  1            # Outcome Date GA Lbr Len/2nd Weight Sex Delivery Anes PTL Lv  2 SAB 08/25/06 [redacted]w[redacted]d         1 Preterm 03/11/94 [redacted]w[redacted]d  1 lb 5 oz (0.595 kg)     FD      Past Medical History:  Diagnosis Date  . Dysmenorrhea   . Fibroids     Past Surgical History:  Procedure Laterality Date  . BREAST BIOPSY    . CESAREAN SECTION       Current Outpatient Prescriptions:  .  Biotin 5 MG TABS, Take by mouth., Disp: , Rfl:  .  norethindrone (MICRONOR,CAMILA,ERRIN) 0.35 MG tablet, Take 1 tablet (0.35 mg  total) by mouth daily., Disp: 28 tablet, Rfl: 1 .  vitamin B-12 (CYANOCOBALAMIN) 1000 MCG tablet, Take 1,000 mcg by mouth daily. Reported on 09/26/2015, Disp: , Rfl:  .  fluconazole (DIFLUCAN) 200 MG tablet, Take 1 tablet (200 mg total) by mouth once. Repeat dose in 48-72 hours., Disp: 3 tablet, Rfl: 0 .  metroNIDAZOLE (FLAGYL) 500 MG tablet, Take 1 tablet (500 mg total) by mouth 2 (two) times daily., Disp: 14 tablet, Rfl: 0 .  norethindrone (MICRONOR,CAMILA,ERRIN) 0.35 MG tablet, TAKE ONE TABLET BY MOUTH ONCE DAILY, Disp: 28 tablet, Rfl: 1 No Known Allergies  Social History  Substance Use Topics  . Smoking status: Current Every Day Smoker    Packs/day: 0.50    Years: 10.00    Types: Cigarettes  . Smokeless tobacco: Never Used  . Alcohol use 1.8 oz/week    3 Shots of liquor per week     Comment: couple days out of the week    Family History  Problem Relation Age of Onset  . Hypertension Mother   . Kidney disease Mother   . Diabetes Maternal Aunt   . Colon cancer Maternal Aunt   . Hypertension Maternal Grandmother   . Breast cancer Neg Hx       Review of Systems  Constitutional: negative for fatigue and weight loss Respiratory: negative for cough and wheezing Cardiovascular: negative for chest pain, fatigue and palpitations Gastrointestinal: negative for abdominal pain and change in bowel habits Musculoskeletal:negative for myalgias Neurological: negative for gait problems and tremors Behavioral/Psych: negative for abusive relationship, depression Endocrine: negative for temperature intolerance    Genitourinary:negative for abnormal menstrual periods, genital lesions, hot flashes, sexual problems and vaginal discharge Integument/breast: negative for breast lump, breast tenderness, nipple discharge and skin lesion(s)    Objective:       BP 125/79   Pulse (!) 103   Ht 5' 7.5" (1.715 m)   Wt 182 lb 4.8 oz (82.7 kg)   LMP 07/22/2016   BMI 28.13 kg/m  General:   alert   Skin:   no rash or abnormalities  Lungs:   clear to auscultation bilaterally  Heart:   regular rate and rhythm, S1, S2 normal, no murmur, click, rub or gallop  Breasts:   normal without suspicious masses, skin or nipple changes or axillary nodes  Abdomen:  normal findings: no organomegaly, soft, non-tender and no hernia  Pelvis:  External genitalia: normal general appearance Urinary system: urethral meatus normal and bladder without fullness, nontender Vaginal: normal without tenderness, induration or masses Cervix: normal appearance Adnexa: normal bimanual exam Uterus: anteverted and non-tender, normal size   Lab Review Urine pregnancy test Labs reviewed yes Radiologic studies reviewed yes  50% of 30 min visit spent on counseling and coordination of care.    Assessment:    Healthy female exam.   1. Well woman exam with routine gynecological exam     Tobacco abuse counseling: stopping encouraged.  - Cytology - PAP  2. Vaginal discharge      - Cytology - PAP  3. Yeast infection involving the vagina and surrounding area      - fluconazole (DIFLUCAN) 200 MG tablet; Take 1 tablet (200 mg total) by mouth once. Repeat dose in 48-72 hours.  Dispense: 3 tablet; Refill: 0  Plan:    Education reviewed: calcium supplements, depression evaluation, low fat, low cholesterol diet, safe sex/STD prevention, self breast exams, skin cancer screening, smoking cessation and weight bearing exercise. Contraception: oral progesterone-only contraceptive. Follow up in: 1 year.   Meds ordered this encounter  Medications  . fluconazole (DIFLUCAN) 200 MG tablet    Sig: Take 1 tablet (200 mg total) by mouth once. Repeat dose in 48-72 hours.    Dispense:  3 tablet    Refill:  0   Orders Placed This Encounter  Procedures  . POCT urine pregnancy

## 2016-08-16 LAB — CYTOLOGY - PAP
Chlamydia: NEGATIVE
Diagnosis: NEGATIVE
HPV 16/18/45 GENOTYPING: NEGATIVE
HPV: DETECTED — AB
NEISSERIA GONORRHEA: NEGATIVE
TRICH (WINDOWPATH): NEGATIVE

## 2016-09-01 ENCOUNTER — Other Ambulatory Visit: Payer: Self-pay | Admitting: Obstetrics

## 2016-09-01 DIAGNOSIS — Z3041 Encounter for surveillance of contraceptive pills: Secondary | ICD-10-CM

## 2016-12-05 ENCOUNTER — Other Ambulatory Visit: Payer: Self-pay | Admitting: Obstetrics

## 2016-12-05 DIAGNOSIS — Z3041 Encounter for surveillance of contraceptive pills: Secondary | ICD-10-CM

## 2016-12-09 ENCOUNTER — Other Ambulatory Visit: Payer: Self-pay | Admitting: Certified Nurse Midwife

## 2016-12-09 DIAGNOSIS — Z3041 Encounter for surveillance of contraceptive pills: Secondary | ICD-10-CM

## 2016-12-09 MED ORDER — NORETHINDRONE 0.35 MG PO TABS
1.0000 | ORAL_TABLET | Freq: Every day | ORAL | 4 refills | Status: DC
Start: 2016-12-09 — End: 2017-08-03

## 2017-04-30 DIAGNOSIS — M19071 Primary osteoarthritis, right ankle and foot: Secondary | ICD-10-CM | POA: Diagnosis not present

## 2017-04-30 DIAGNOSIS — M7752 Other enthesopathy of left foot: Secondary | ICD-10-CM | POA: Diagnosis not present

## 2017-04-30 DIAGNOSIS — M19072 Primary osteoarthritis, left ankle and foot: Secondary | ICD-10-CM | POA: Diagnosis not present

## 2017-04-30 DIAGNOSIS — M7751 Other enthesopathy of right foot: Secondary | ICD-10-CM | POA: Diagnosis not present

## 2017-04-30 DIAGNOSIS — M67372 Transient synovitis, left ankle and foot: Secondary | ICD-10-CM | POA: Diagnosis not present

## 2017-04-30 DIAGNOSIS — M67371 Transient synovitis, right ankle and foot: Secondary | ICD-10-CM | POA: Diagnosis not present

## 2017-06-16 DIAGNOSIS — R030 Elevated blood-pressure reading, without diagnosis of hypertension: Secondary | ICD-10-CM | POA: Diagnosis not present

## 2017-07-14 DIAGNOSIS — F17210 Nicotine dependence, cigarettes, uncomplicated: Secondary | ICD-10-CM | POA: Diagnosis not present

## 2017-07-14 DIAGNOSIS — R030 Elevated blood-pressure reading, without diagnosis of hypertension: Secondary | ICD-10-CM | POA: Diagnosis not present

## 2017-08-03 ENCOUNTER — Other Ambulatory Visit: Payer: Self-pay | Admitting: Certified Nurse Midwife

## 2017-08-03 DIAGNOSIS — Z3041 Encounter for surveillance of contraceptive pills: Secondary | ICD-10-CM

## 2017-09-01 ENCOUNTER — Other Ambulatory Visit: Payer: Self-pay | Admitting: Obstetrics

## 2017-09-01 DIAGNOSIS — Z3041 Encounter for surveillance of contraceptive pills: Secondary | ICD-10-CM

## 2017-09-17 DIAGNOSIS — F17210 Nicotine dependence, cigarettes, uncomplicated: Secondary | ICD-10-CM | POA: Diagnosis not present

## 2017-09-17 DIAGNOSIS — I10 Essential (primary) hypertension: Secondary | ICD-10-CM | POA: Diagnosis not present

## 2017-09-17 DIAGNOSIS — L0231 Cutaneous abscess of buttock: Secondary | ICD-10-CM | POA: Diagnosis not present

## 2017-09-29 DIAGNOSIS — F6381 Intermittent explosive disorder: Secondary | ICD-10-CM | POA: Diagnosis not present

## 2017-10-14 DIAGNOSIS — F6381 Intermittent explosive disorder: Secondary | ICD-10-CM | POA: Diagnosis not present

## 2017-10-20 DIAGNOSIS — F6381 Intermittent explosive disorder: Secondary | ICD-10-CM | POA: Diagnosis not present

## 2017-10-24 DIAGNOSIS — K921 Melena: Secondary | ICD-10-CM | POA: Diagnosis not present

## 2017-10-27 DIAGNOSIS — F6381 Intermittent explosive disorder: Secondary | ICD-10-CM | POA: Diagnosis not present

## 2017-11-06 DIAGNOSIS — F6381 Intermittent explosive disorder: Secondary | ICD-10-CM | POA: Diagnosis not present

## 2017-11-18 DIAGNOSIS — F6381 Intermittent explosive disorder: Secondary | ICD-10-CM | POA: Diagnosis not present

## 2017-11-24 DIAGNOSIS — Z7289 Other problems related to lifestyle: Secondary | ICD-10-CM | POA: Diagnosis not present

## 2017-11-24 DIAGNOSIS — K625 Hemorrhage of anus and rectum: Secondary | ICD-10-CM | POA: Diagnosis not present

## 2017-11-24 DIAGNOSIS — F6381 Intermittent explosive disorder: Secondary | ICD-10-CM | POA: Diagnosis not present

## 2017-11-24 DIAGNOSIS — Z800 Family history of malignant neoplasm of digestive organs: Secondary | ICD-10-CM | POA: Diagnosis not present

## 2017-12-01 ENCOUNTER — Ambulatory Visit: Payer: Self-pay | Admitting: Surgery

## 2017-12-01 DIAGNOSIS — L0591 Pilonidal cyst without abscess: Secondary | ICD-10-CM | POA: Diagnosis not present

## 2017-12-01 DIAGNOSIS — F6381 Intermittent explosive disorder: Secondary | ICD-10-CM | POA: Diagnosis not present

## 2017-12-01 DIAGNOSIS — Z720 Tobacco use: Secondary | ICD-10-CM | POA: Diagnosis not present

## 2017-12-01 NOTE — H&P (Signed)
Tiffany [name] Documented: 12/01/2017 2:08 PM Location: [facility] Patient #: [id number] DOB: 1976-02-08 Single / Language: Tiffany [name] / Race: Black or African American Female  History of Present Illness Tiffany Hector MD; 12/01/2017 2:45 PM) The patient is a 42 year old female who presents with a pilonidal cyst. Note for "Pilonidal cyst": ` ` ` Patient sent for surgical consultation at the request of Dr Tiffany [name] Via  Chief Complaint: Abscess on gluteus. Possible pilonidal disease. ` ` The patient is a young female that has had an abscess on her buttocks. Recurrent problem. Sent for surgical consultation. Came in and checked into the room. Then claimed that she was hoping she could have a female Psychologist, sport and exercise. Offer made to reschedule to see Dr. Marcello [name] offered; but; then she changed her mind back and noted that she had canceled before and wanted to go ahead and proceed with the visit with me today.  Smoking female who's had history of an abscess on her buttocks. In the crease. She will get some pain and swelling. It keeps coming back. Can drain every day. Blood & occasional pus. Hard to sit at work. Soaking through clothes. Concerning/Frustrating. Pilonidal disease suspected. She's never had to be on antibiotics are required incision and drainage. However its never really gone away in the past 3 years at least. She does smoke. She is trying to quit. He was thinking about restarting her bupropion to do that.  (Review of systems as stated in this history (HPI) or in the review of systems. Otherwise all other 12 point ROS are negative) ` ` `   Past Surgical History Tiffany [name]; 12/01/2017 2:09 PM) Breast Biopsy Right. Cesarean Section - 1  Diagnostic Studies History Tiffany [name]; 12/01/2017 2:08 PM) Colonoscopy never Mammogram >3 years ago Pap Smear 1-5 years ago  Allergies Tiffany [name]; 12/01/2017 2:09 PM) No Known Drug Allergies  [12/01/2017]: Allergies Reconciled  Medication History Tiffany [name]; 0/45/4098 1:19 PM) Folic Acid (1MG  Tablet, Oral) Active. Tiffany [name] (0.35MG  Tablet, Oral) Active. buPROPion HCl ER (Smoking Det) (150MG  Tablet ER 12HR, Oral) Active. Vitamin B12 (100MCG Tablet, Oral) Active. Medications Reconciled  Social History Tiffany [name]; 12/01/2017 2:09 PM) Alcohol use Heavy alcohol use. Caffeine use Coffee, Tea. Illicit drug use Prefer to discuss with provider. Tobacco use Current every day smoker.  Family History Tiffany [name]; 12/01/2017 2:09 PM) Alcohol Abuse Brother, Sister. Arthritis Mother. Cerebrovascular Accident Mother. Heart Disease Mother. Hypertension Mother. Thyroid problems Mother.  Pregnancy / Birth History Tiffany [name]; 12/01/2017 2:09 PM) Age at menarche 32 years. Contraceptive History Oral contraceptives. Gravida 3 Irregular periods Maternal age 24-20 Para 1  Other Problems Tiffany [name]; 12/01/2017 2:09 PM) Chest pain High blood pressure     Review of Systems Tiffany [name]; 12/01/2017 2:09 PM) General Present- Appetite Loss, Night Sweats, Weight Gain and Weight Loss. Not Present- Chills, Fatigue and Fever. Skin Present- New Lesions and Non-Healing Wounds. Not Present- Change in Wart/Mole, Dryness, Hives, Jaundice, Rash and Ulcer. Cardiovascular Present- Shortness of Breath. Not Present- Chest Pain, Difficulty Breathing Lying Down, Leg Cramps, Palpitations, Rapid Heart Rate and Swelling of Extremities. Gastrointestinal Present- Bloody Stool, Change in Bowel Habits and Gets full quickly at meals. Not Present- Abdominal Pain, Bloating, Chronic diarrhea, Constipation, Difficulty Swallowing, Excessive gas, Hemorrhoids, Indigestion, Nausea, Rectal Pain and Vomiting. Female Genitourinary Present- Frequency. Not Present- Nocturia, Painful Urination, Pelvic Pain and Urgency. Musculoskeletal Not Present- Back Pain,  Joint Pain, Joint Stiffness, Muscle Pain, Muscle Weakness and Swelling of Extremities. Neurological Not Present- Decreased Memory,  Fainting, Headaches, Numbness, Seizures, Tingling, Tremor, Trouble walking and Weakness. Psychiatric Not Present- Anxiety, Bipolar, Change in Sleep Pattern, Depression, Fearful and Frequent crying. Endocrine Not Present- Cold Intolerance, Excessive Hunger, Hair Changes, Heat Intolerance, Hot flashes and New Diabetes. Hematology Not Present- Blood Thinners, Easy Bruising, Excessive bleeding, Gland problems, HIV and Persistent Infections.  Vitals Tiffany [name]; 12/01/2017 2:10 PM) 12/01/2017 2:10 PM Weight: 176.4 lb Height: 67.5in Body Surface Area: 1.93 m Body Mass Index: 27.22 kg/m  Pain Level: 0/10 Temp.: 98.54F(Temporal)  Pulse: 107 (Regular)  P.OX: 97% (Room air) BP: 130/88 (Sitting, Left Arm, Standard)      Physical Exam Tiffany Hector MD; 12/01/2017 2:42 PM)  General Mental Status-Alert. General Appearance-Not in acute distress, Not Sickly. Orientation-Oriented X3. Hydration-Well hydrated. Voice-Normal.  Integumentary Global Assessment Upon inspection and palpation of skin surfaces of the - Axillae: non-tender, no inflammation or ulceration, no drainage. and Distribution of scalp and body hair is normal. General Characteristics Temperature - normal warmth is noted.  Head and Neck Head-normocephalic, atraumatic with no lesions or palpable masses. Face Global Assessment - atraumatic, no absence of expression. Neck Global Assessment - no abnormal movements, no bruit auscultated on the right, no bruit auscultated on the left, no decreased range of motion, non-tender. Trachea-midline. Thyroid Gland Characteristics - non-tender.  Eye Eyeball - Left-Extraocular movements intact, No Nystagmus. Eyeball - Right-Extraocular movements intact, No Nystagmus. Cornea - Left-No Hazy. Cornea - Right-No  Hazy. Sclera/Conjunctiva - Left-No scleral icterus, No Discharge. Sclera/Conjunctiva - Right-No scleral icterus, No Discharge. Pupil - Left-Direct reaction to light normal. Pupil - Right-Direct reaction to light normal.  ENMT Ears Pinna - Left - no drainage observed, no generalized tenderness observed. Right - no drainage observed, no generalized tenderness observed. Nose and Sinuses External Inspection of the Nose - no destructive lesion observed. Inspection of the nares - Left - quiet respiration. Right - quiet respiration. Mouth and Throat Lips - Upper Lip - no fissures observed, no pallor noted. Lower Lip - no fissures observed, no pallor noted. Nasopharynx - no discharge present. Oral Cavity/Oropharynx - Tongue - no dryness observed. Oral Mucosa - no cyanosis observed. Hypopharynx - no evidence of airway distress observed.  Chest and Lung Exam Inspection Movements - Normal and Symmetrical. Accessory muscles - No use of accessory muscles in breathing. Palpation Palpation of the chest reveals - Non-tender. Auscultation Breath sounds - Normal and Clear.  Cardiovascular Auscultation Rhythm - Regular. Murmurs & Other Heart Sounds - Auscultation of the heart reveals - No Murmurs and No Systolic Clicks.  Abdomen Inspection Inspection of the abdomen reveals - No Visible peristalsis and No Abnormal pulsations. Umbilicus - No Bleeding, No Urine drainage. Palpation/Percussion Palpation and Percussion of the abdomen reveal - Soft, Non Tender, No Rebound tenderness, No Rigidity (guarding) and No Cutaneous hyperesthesia. Note: Abdomen soft. Not severely distended. No distasis recti. No umbilical or other anterior abdominal wall hernias  Female Genitourinary Sexual Maturity Tanner 5 - Adult hair pattern. Note: No vaginal bleeding nor discharge  Rectal Note: Left-sided intergluteal cleft sinuses over a 4 to have centimeter region. Not scarring going down to the sacrum in the  upper intergluteal cleft. Classic for pilonidal disease. No active cellulitis or abscess at this moment. Repair anal skin clear with no fistula or perirectal abscess or fissure. No external hemorrhoids. Sphincter tone within normal limits. Held off on any internal exam.  Peripheral Vascular Upper Extremity Inspection - Left - No Cyanotic nailbeds, Not Ischemic. Right - No Cyanotic nailbeds, Not Ischemic.  Neurologic Neurologic evaluation reveals -normal attention span and ability to concentrate, able to name objects and repeat phrases. Appropriate fund of knowledge , normal sensation and normal coordination. Mental Status Affect - not angry, not paranoid. Cranial Nerves-Normal Bilaterally. Gait-Normal.  Neuropsychiatric Mental status exam performed with findings of-able to articulate well with normal speech/language, rate, volume and coherence, thought content normal with ability to perform basic computations and apply abstract reasoning and no evidence of hallucinations, delusions, obsessions or homicidal/suicidal ideation.  Musculoskeletal Global Assessment Spine, Ribs and Pelvis - no instability, subluxation or laxity. Right Upper Extremity - no instability, subluxation or laxity.  Lymphatic Head & Neck  General Head & Neck Lymphatics: Bilateral - Description - No Localized lymphadenopathy. Axillary  General Axillary Region: Bilateral - Description - No Localized lymphadenopathy. Femoral & Inguinal  Generalized Femoral & Inguinal Lymphatics: Left - Description - No Localized lymphadenopathy. Right - Description - No Localized lymphadenopathy.    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 I think she would benefit from outpatient surgical excision. Closure of her wicks.  I strongly recommend she quit  smoking to minimize the risk of wound failure and recurrence. She is interested in proceeding.  She was hoping she can go back to work the next day. I cautioned her that was not realistic and recommended on average 2 weeks before returning to desk duty. Sometimes sooner sometimes longer.  The anatomy of the gluteal and sacral region was discussed. Pathophysiology of pilonidal disease due to ingrown hairs was discussed. Importance of good hygiene discussed. Importance of keeping hairs trimmed in the intergluteal crease from anus to top of sacrum/tailbone spine noted. Need for good hygiene stressed. Use of electric razor or nose hair trimmers to keep the hairs trimmed discussed.  Risk of worsening progression needing surgical drainage of abscess or perhaps excision of chronic pilonidal disease with skin flap closure over drains discussed. Possible need for her to leave it open with packing to allow the wound close over several weeks/months discussed. Risks benefits alternatives to surgery discussed as well. Risk of recurrent disease discussed. Patient was expressed understanding. Literature given to patient. We will see if we can control this without an operation first.  Current Plans The anatomy of the intragluteal cleft was discussed. Pathophysiology of pilonidal disease was discussed. The importance of keeping hairs trimmed to avoid recurrence was discussed. Discussion of options such as curretage, excision with closure vs leaving open was discussed. Risks of infection with need for incision and drainage & antibiotics were discussed. I noted a good likelihood this will help address the problem.  At this point, I think the patient would best served with considering surgery to excise the diseased tissue. I will make an attempt to close but it may need to be left open to allow it to heal with secondary intention and wound packing. Possible recurrences need reoperation or different techniques were  discussed as well. I noted that recurrence is higher with poor compliance on hair removal hygiene & overall health. The patient's questions were answered. The patient agrees to proceed.  Pt Education - [facility] Pilonidal Disease (AT) Pt Education - [facility] Pilonidal Surgery HCI - post op instructions: discussed with patient and provided information.  TOBACCO ABUSE (Z72.0)  Current Plans Pt Education - [facility] STOP SMOKING!  STOP SMOKING! We talked to the patient about the dangers of smoking.  We  stressed that tobacco use dramatically increases the risk of peri-operative complications such as infection, tissue necrosis leaving to problems with incision/wound and organ healing, hernia, chronic pain, heart attack, stroke, DVT, pulmonary embolism, and death.  We noted there are programs in our community to help stop smoking.  Information was available.    Tiffany Hector, MD, FACS, MASCRS [facility]astrointestinal and [facility]ally In[facility] Surgery    [address], [place], [place] [address] [phone] Main / Paging [phone] Fax

## 2017-12-08 DIAGNOSIS — F6381 Intermittent explosive disorder: Secondary | ICD-10-CM | POA: Diagnosis not present

## 2017-12-15 DIAGNOSIS — F6381 Intermittent explosive disorder: Secondary | ICD-10-CM | POA: Diagnosis not present

## 2018-01-05 DIAGNOSIS — F6381 Intermittent explosive disorder: Secondary | ICD-10-CM | POA: Diagnosis not present

## 2018-01-12 DIAGNOSIS — F6381 Intermittent explosive disorder: Secondary | ICD-10-CM | POA: Diagnosis not present

## 2018-01-19 DIAGNOSIS — F6381 Intermittent explosive disorder: Secondary | ICD-10-CM | POA: Diagnosis not present

## 2018-01-26 DIAGNOSIS — F6381 Intermittent explosive disorder: Secondary | ICD-10-CM | POA: Diagnosis not present

## 2018-02-09 DIAGNOSIS — F6381 Intermittent explosive disorder: Secondary | ICD-10-CM | POA: Diagnosis not present

## 2018-02-16 DIAGNOSIS — F6381 Intermittent explosive disorder: Secondary | ICD-10-CM | POA: Diagnosis not present

## 2018-02-23 DIAGNOSIS — F6381 Intermittent explosive disorder: Secondary | ICD-10-CM | POA: Diagnosis not present

## 2018-03-09 DIAGNOSIS — F6381 Intermittent explosive disorder: Secondary | ICD-10-CM | POA: Diagnosis not present

## 2018-04-30 ENCOUNTER — Ambulatory Visit: Payer: No Typology Code available for payment source | Admitting: Certified Nurse Midwife

## 2018-06-15 ENCOUNTER — Other Ambulatory Visit: Payer: Self-pay | Admitting: Family Medicine

## 2018-06-15 ENCOUNTER — Other Ambulatory Visit (HOSPITAL_COMMUNITY)
Admission: RE | Admit: 2018-06-15 | Discharge: 2018-06-15 | Disposition: A | Discharge status: Home / Self Care | Payer: Commercial Managed Care - PPO | Source: Ambulatory Visit | Attending: Family Medicine | Admitting: Family Medicine

## 2018-06-15 DIAGNOSIS — Z124 Encounter for screening for malignant neoplasm of cervix: Principal | ICD-10-CM

## 2018-06-15 DIAGNOSIS — D7589 Other specified diseases of blood and blood-forming organs: Secondary | ICD-10-CM | POA: Diagnosis not present

## 2018-06-15 DIAGNOSIS — Z0000 Encounter for general adult medical examination without abnormal findings: Secondary | ICD-10-CM | POA: Diagnosis not present

## 2018-06-15 DIAGNOSIS — Z136 Encounter for screening for cardiovascular disorders: Secondary | ICD-10-CM | POA: Diagnosis not present

## 2018-06-15 DIAGNOSIS — Z1239 Encounter for other screening for malignant neoplasm of breast: Secondary | ICD-10-CM | POA: Diagnosis not present

## 2018-06-15 DIAGNOSIS — Z131 Encounter for screening for diabetes mellitus: Secondary | ICD-10-CM | POA: Diagnosis not present

## 2018-06-15 DIAGNOSIS — Z23 Encounter for immunization: Secondary | ICD-10-CM | POA: Diagnosis not present

## 2018-06-17 LAB — CYTOLOGY - PAP
Chlamydia: NEGATIVE
Diagnosis: NEGATIVE
HPV: NOT DETECTED
Neisseria Gonorrhea: NEGATIVE

## 2018-06-24 ENCOUNTER — Other Ambulatory Visit: Payer: Self-pay | Admitting: Family Medicine

## 2018-06-24 DIAGNOSIS — Z1231 Encounter for screening mammogram for malignant neoplasm of breast: Secondary | ICD-10-CM

## 2018-08-21 ENCOUNTER — Ambulatory Visit: Payer: No Typology Code available for payment source

## 2018-08-24 ENCOUNTER — Other Ambulatory Visit: Payer: Self-pay

## 2018-08-24 ENCOUNTER — Ambulatory Visit
Admission: RE | Admit: 2018-08-24 | Discharge: 2018-08-24 | Disposition: A | Discharge status: Home / Self Care | Payer: No Typology Code available for payment source | Source: Ambulatory Visit | Attending: Family Medicine | Admitting: Family Medicine

## 2018-08-24 DIAGNOSIS — Z1231 Encounter for screening mammogram for malignant neoplasm of breast: Secondary | ICD-10-CM

## 2018-10-26 ENCOUNTER — Other Ambulatory Visit: Payer: Self-pay | Admitting: Obstetrics

## 2018-10-26 DIAGNOSIS — Z3041 Encounter for surveillance of contraceptive pills: Secondary | ICD-10-CM

## 2019-02-18 LAB — HM COLONOSCOPY

## 2019-06-24 LAB — HM SIGMOIDOSCOPY

## 2019-07-01 ENCOUNTER — Other Ambulatory Visit: Payer: Self-pay | Admitting: Family Medicine

## 2019-07-01 ENCOUNTER — Other Ambulatory Visit (HOSPITAL_COMMUNITY)
Admission: RE | Admit: 2019-07-01 | Discharge: 2019-07-01 | Disposition: A | Discharge status: Home / Self Care | Payer: 59 | Source: Ambulatory Visit | Attending: Family Medicine | Admitting: Family Medicine

## 2019-07-01 DIAGNOSIS — Z124 Encounter for screening for malignant neoplasm of cervix: Secondary | ICD-10-CM | POA: Diagnosis not present

## 2019-07-05 LAB — CYTOLOGY - PAP
Chlamydia: NEGATIVE
Comment: NEGATIVE
Comment: NEGATIVE
Comment: NEGATIVE
Comment: NORMAL
Diagnosis: NEGATIVE
High risk HPV: NEGATIVE
Neisseria Gonorrhea: NEGATIVE
Trichomonas: NEGATIVE

## 2019-07-30 ENCOUNTER — Other Ambulatory Visit: Payer: Self-pay | Admitting: Family Medicine

## 2019-07-30 DIAGNOSIS — Z1231 Encounter for screening mammogram for malignant neoplasm of breast: Secondary | ICD-10-CM

## 2019-08-25 ENCOUNTER — Ambulatory Visit
Admission: RE | Admit: 2019-08-25 | Discharge: 2019-08-25 | Disposition: A | Discharge status: Home / Self Care | Payer: No Typology Code available for payment source | Source: Ambulatory Visit | Attending: Family Medicine | Admitting: Family Medicine

## 2019-08-25 ENCOUNTER — Other Ambulatory Visit: Payer: Self-pay

## 2019-08-25 DIAGNOSIS — Z1231 Encounter for screening mammogram for malignant neoplasm of breast: Secondary | ICD-10-CM

## 2019-10-22 ENCOUNTER — Other Ambulatory Visit: Payer: Self-pay | Admitting: Obstetrics

## 2019-10-22 DIAGNOSIS — Z3041 Encounter for surveillance of contraceptive pills: Secondary | ICD-10-CM

## 2020-07-18 ENCOUNTER — Other Ambulatory Visit: Payer: Self-pay | Admitting: Family Medicine

## 2020-07-18 DIAGNOSIS — Z1231 Encounter for screening mammogram for malignant neoplasm of breast: Secondary | ICD-10-CM

## 2020-09-13 ENCOUNTER — Ambulatory Visit: Payer: 59

## 2020-10-12 ENCOUNTER — Encounter (HOSPITAL_COMMUNITY): Payer: Self-pay

## 2020-10-12 ENCOUNTER — Other Ambulatory Visit: Payer: Self-pay

## 2020-10-12 ENCOUNTER — Ambulatory Visit (HOSPITAL_COMMUNITY)
Admission: EM | Admit: 2020-10-12 | Discharge: 2020-10-12 | Disposition: A | Discharge status: Home / Self Care | Payer: Self-pay | Attending: Emergency Medicine | Admitting: Emergency Medicine

## 2020-10-12 DIAGNOSIS — B349 Viral infection, unspecified: Secondary | ICD-10-CM

## 2020-10-12 DIAGNOSIS — U071 COVID-19: Secondary | ICD-10-CM | POA: Insufficient documentation

## 2020-10-12 LAB — SARS CORONAVIRUS 2 (TAT 6-24 HRS): SARS Coronavirus 2: POSITIVE — AB

## 2020-10-12 IMAGING — MG DIGITAL SCREENING BILAT W/ TOMO W/ CAD
8 series · 8 of 24 positions shown · non-contrast
Comparison: Previous exam(s).

CLINICAL DATA: Screening.

EXAM:
DIGITAL SCREENING BILATERAL MAMMOGRAM WITH TOMO AND CAD

[L CC synth-2D]
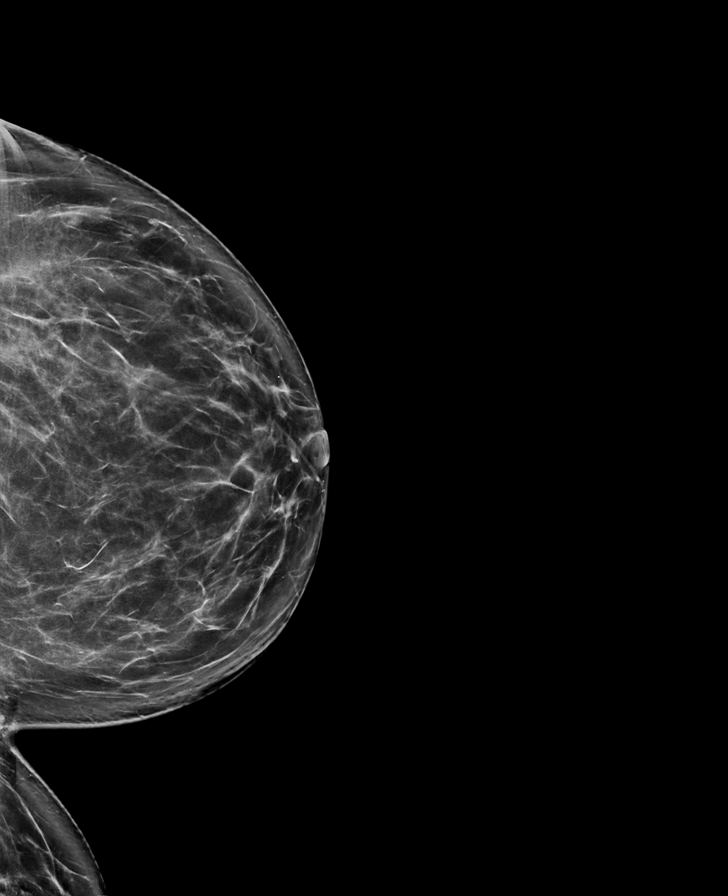

[L MLO synth-2D]
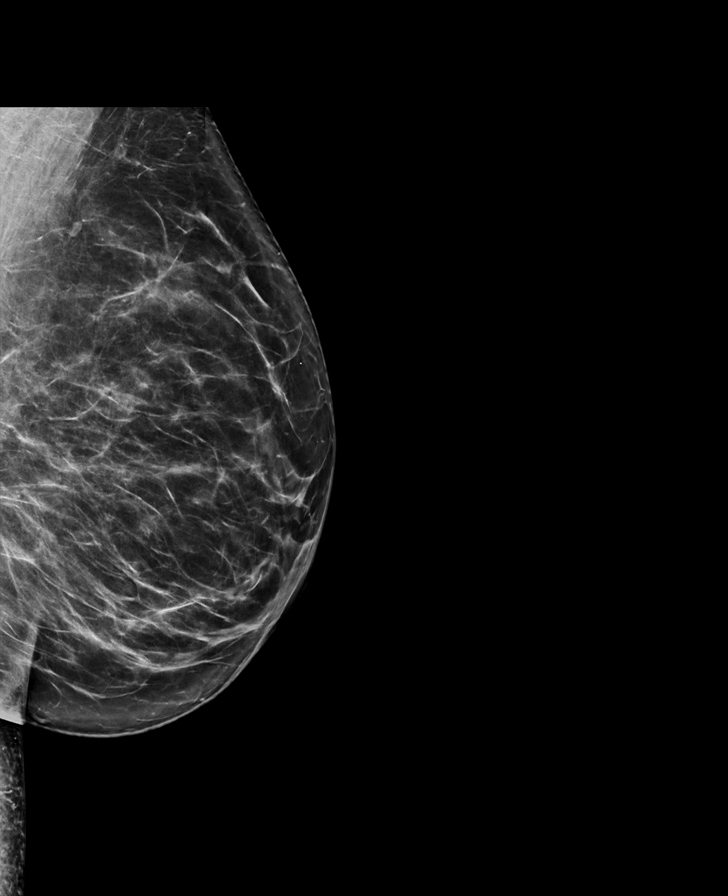

[R CC synth-2D]
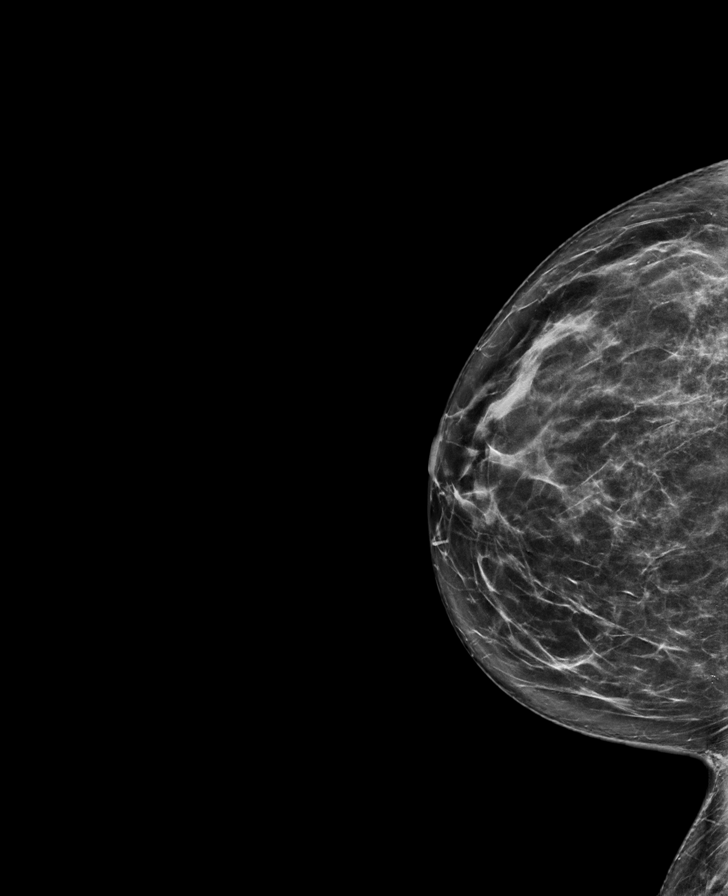

[R MLO synth-2D]
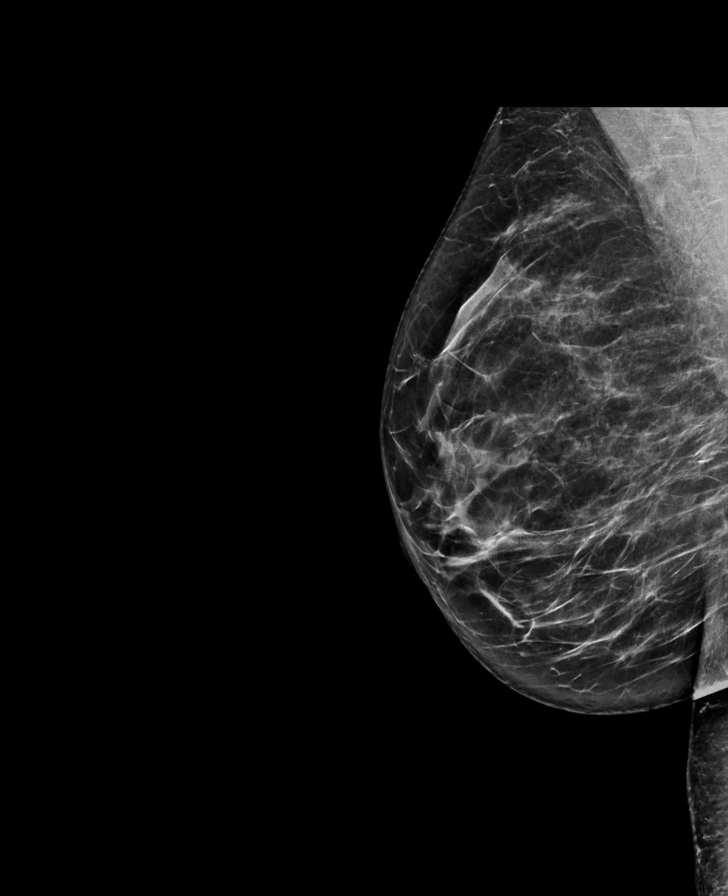

[R CC tomo · tomo slice 37/73.0]
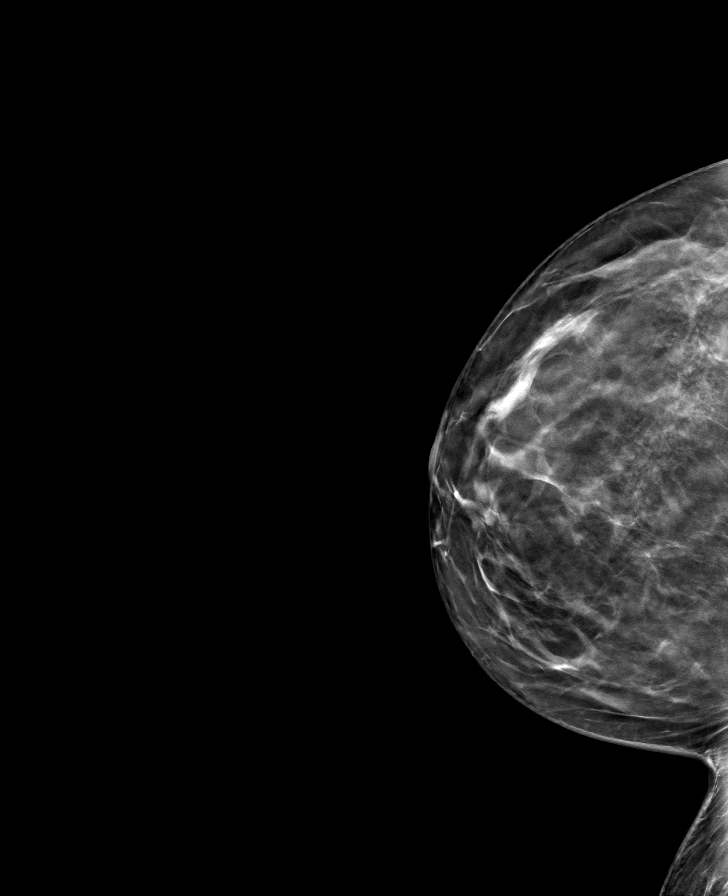

[L CC tomo · tomo slice 43/86.0]
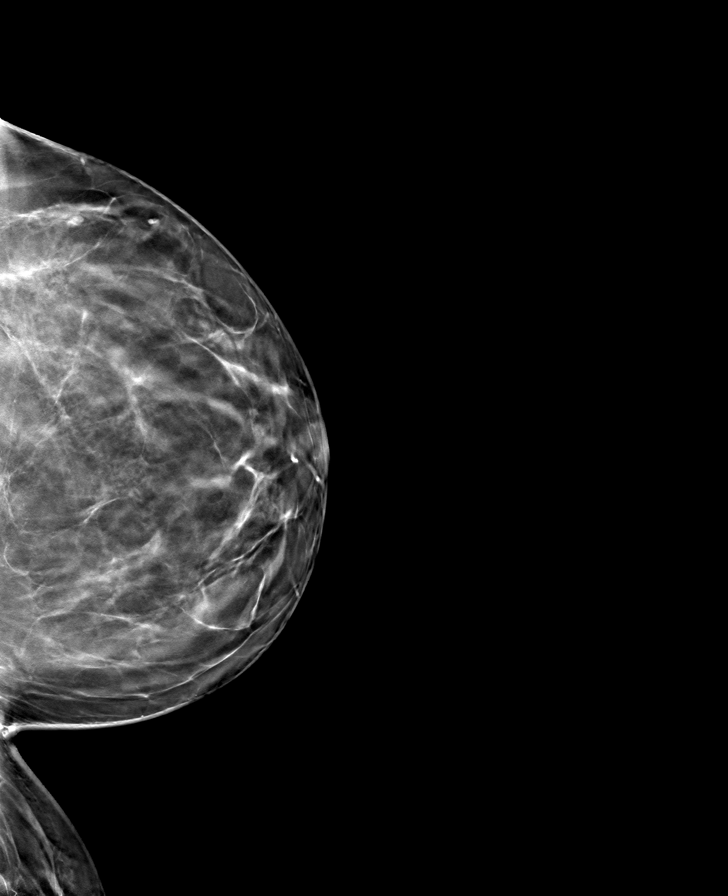

[R MLO tomo · tomo slice 39/77.0]
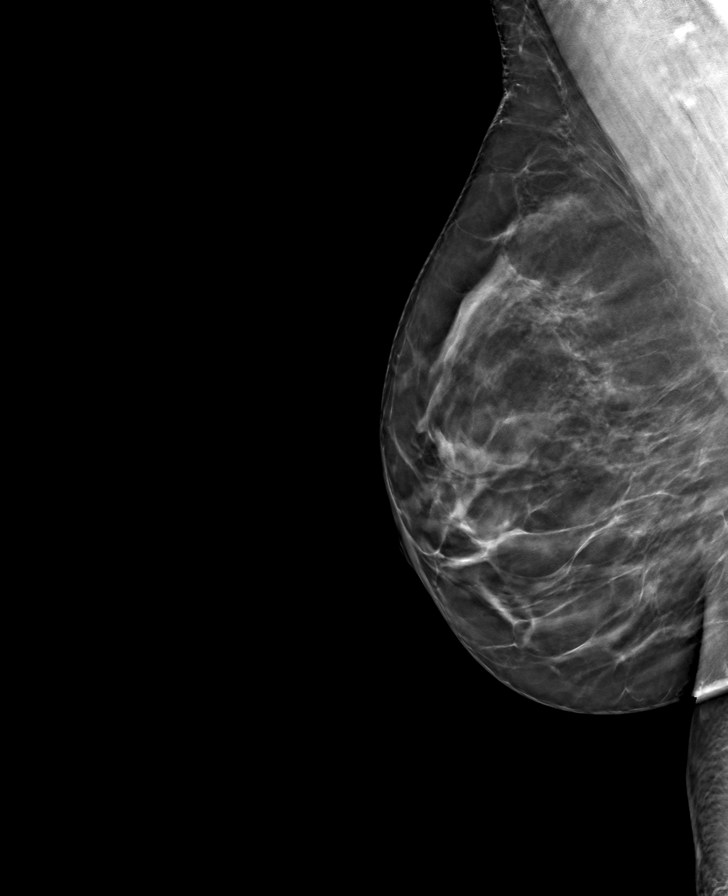

[L MLO tomo · tomo slice 41/80.0]
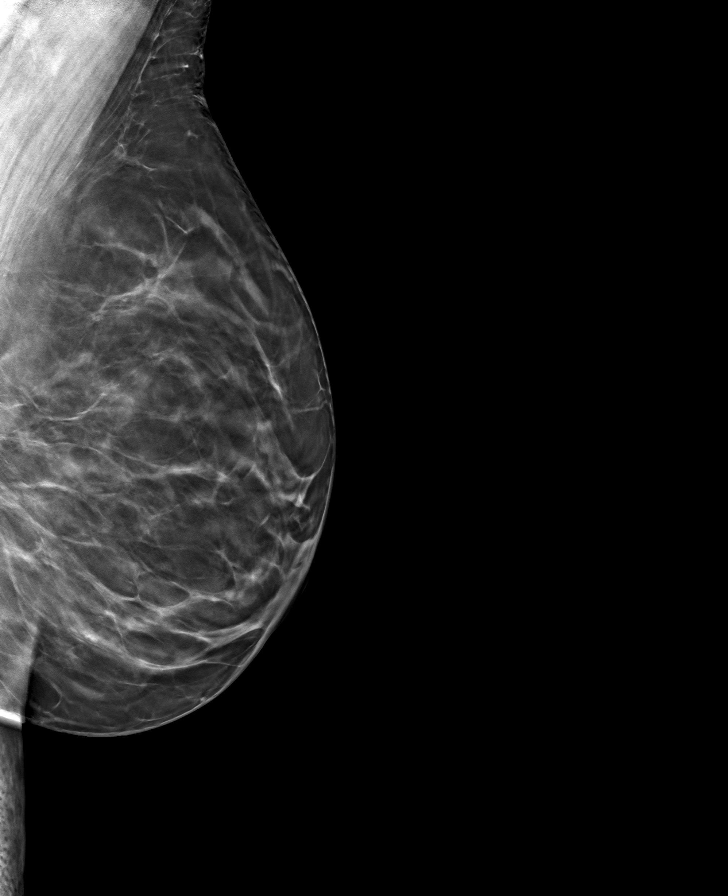

[8 of 24 positions shown; findings below may reference images not displayed]

ACR Breast Density Category b: There are scattered areas of
fibroglandular density.
FINDINGS: There are no findings suspicious for malignancy. Images were
processed with CAD.
IMPRESSION: No mammographic evidence of malignancy. A result letter of this
screening mammogram will be mailed directly to the patient.

RECOMMENDATION:
Screening mammogram in one year. (Code:[id number])

BI-RADS CATEGORY  1: Negative.

## 2020-10-12 NOTE — ED Provider Notes (Signed)
[place]    CSN: [id number] Arrival date & time: 10/12/20  0830      History   Chief Complaint No chief complaint on file.   HPI Tiffany [name] is a 45 y.o. female.    Patient presents with generalized body aches, bilateral lower back pain, chills, productive cough, congestion, rhinorrhea for 2 days.  Initially 3 days ago had sharp pain in her left upper thigh radiating into the back that has resolved.  Denies headaches, sore throat, ear pain or fullness, wheezing, shortness of breath, abdominal pain, nausea, vomiting, diarrhea.  No known sick contacts.  Vaccinated.  Current daily smoker.  took COVID test yesterday which was positive.  Using Tylenol and elderberry Gummies for symptom management.  Past Medical History:  Diagnosis Date   Dysmenorrhea    Fibroids     Patient Active Problem List   Diagnosis Date Noted   Family history of colon cancer 99991111   Folliculitis 99991111   Excessive sweating 09/26/2015   Routine general medical examination at a health care facility 07/07/2014   Open wound of face 07/07/2014   Current smoker 05/18/2014   Genital herpes 05/11/2013   Irregular menstrual cycle 08/27/2012   Dysmenorrhea 08/27/2012    Past Surgical History:  Procedure Laterality Date   BREAST BIOPSY     CESAREAN SECTION      OB History     Gravida  2   Para  1   Term      Preterm  1   AB  1   Living         SAB  1   IAB      Ectopic      Multiple      Live Births               Home Medications    Prior to Admission medications   Medication Sig Start Date End Date Taking? Authorizing Provider  Biotin 5 MG TABS Take by mouth.    [provider]  metroNIDAZOLE (FLAGYL) 500 MG tablet Take 1 tablet (500 mg total) by mouth 2 (two) times daily. 10/03/15   Kandis Cocking A, CNM  [name] 0.35 MG tablet TAKE 1 TABLET BY MOUTH DAILY 09/01/17   Shelly Bombard, MD  NORLYDA 0.35 MG tablet TAKE 1 TABLET BY MOUTH DAILY  10/22/19   Shelly Bombard, MD  vitamin B-12 (CYANOCOBALAMIN) 1000 MCG tablet Take 1,000 mcg by mouth daily. Reported on 09/26/2015    [provider]    Family History Family History  Problem Relation Age of Onset   Hypertension Mother    Kidney disease Mother    Diabetes Maternal Aunt    Colon cancer Maternal Aunt    Hypertension Maternal Grandmother    Breast cancer Neg Hx     Social History Social History   Tobacco Use   Smoking status: Every Day    Packs/day: 0.50    Years: 10.00    Pack years: 5.00    Types: Cigarettes   Smokeless tobacco: Never  Substance Use Topics   Alcohol use: Yes    Alcohol/week: 3.0 standard drinks    Types: 3 Shots of liquor per week    Comment: couple days out of the week   Drug use: No     Allergies   Patient has no known allergies.   Review of Systems Review of Systems Defer to [facility]      Physical Exam Triage Vital  Signs ED Triage Vitals  Enc Vitals Group     BP 10/12/20 0928 (!) 145/102     Pulse Rate 10/12/20 0928 90     Resp 10/12/20 0928 19     Temp 10/12/20 0928 99.1 F (37.3 C)     Temp Source 10/12/20 0928 Oral     SpO2 10/12/20 0928 99 %     Weight --      Height --      Head Circumference --      Peak Flow --      Pain Score 10/12/20 0926 0     Pain Loc --      Pain Edu? --      Excl. in [place]? --    No data found.  Updated Vital Signs BP (!) 145/102 (BP Location: Right Arm) Comment: Pt states she is nervous.  Pulse 90   Temp 99.1 F (37.3 C) (Oral)   Resp 19   LMP  (LMP Unknown)   SpO2 99%   Visual Acuity Right Eye Distance:   Left Eye Distance:   Bilateral Distance:    Right Eye Near:   Left Eye Near:    Bilateral Near:     Physical Exam Constitutional:      Appearance: Normal appearance. She is normal weight.  HENT:     Head: Normocephalic.     Right Ear: Tympanic membrane, ear canal and external ear normal.     Left Ear: Tympanic membrane, ear canal and external ear normal.      Nose: Congestion and rhinorrhea present.     Mouth/Throat:     Mouth: Mucous membranes are moist.     Pharynx: Posterior oropharyngeal erythema present.  Eyes:     Extraocular Movements: Extraocular movements intact.     Conjunctiva/sclera: Conjunctivae normal.     Pupils: Pupils are equal, round, and reactive to light.  Cardiovascular:     Rate and Rhythm: Normal rate and regular rhythm.     Pulses: Normal pulses.     Heart sounds: Normal heart sounds.  Pulmonary:     Effort: Pulmonary effort is normal.     Breath sounds: Normal breath sounds.  Musculoskeletal:        General: Normal range of motion.     Cervical back: Normal range of motion and neck supple.  Skin:    General: Skin is warm and dry.  Neurological:     Mental Status: She is alert and oriented to person, place, and time. Mental status is at baseline.  Psychiatric:        Mood and Affect: Mood normal.        Behavior: Behavior normal.     UC Treatments / Results  Labs (all labs ordered are listed, but only abnormal results are displayed) Labs Reviewed  SARS CORONAVIRUS 2 (TAT 6-24 HRS)    EKG   Radiology No results found.  Procedures Procedures (including critical care time)  Medications Ordered in UC Medications - No data to display  Initial Impression / Assessment and Plan / UC Course  I have reviewed the triage vital signs and the nursing notes.  Pertinent labs & imaging results that were available during my care of the patient were reviewed by me and considered in my medical decision making (see chart for details).   Viral illness  1.  COVID test pending 2. Over-the-counter medications for symptom management 3. Strict precautions given for follow-up for worsening infection  Final diagnoses:  Viral  illness     Discharge Instructions      COVID test pending 24 hours, you will be called if positive  Can continue use of over-the-counter Tylenol or ibuprofen as for management of  fevers and body aches  Can use any over-the-counter medicine that provide comfort to help manage symptoms  At any point if you begin to have chest pain, shortness of breath, difficulty breathing please follow-up with the nearest emergency department as soon as possible   ED Prescriptions   None    PDMP not reviewed this encounter.   Hans Eden, NP 10/12/20 1047

## 2020-10-12 NOTE — Discharge Instructions (Signed)
COVID test pending 24 hours, you will be called if positive  Can continue use of over-the-counter Tylenol or ibuprofen as for management of fevers and body aches  Can use any over-the-counter medicine that provide comfort to help manage symptoms  At any point if you begin to have chest pain, shortness of breath, difficulty breathing please follow-up with the nearest emergency department as soon as possible

## 2020-10-12 NOTE — ED Triage Notes (Signed)
Pt presents with generalized body aches X 2 days.   States the pain started at the right leg with sharp pain.   States she has had chills, runny nose.

## 2020-10-25 ENCOUNTER — Other Ambulatory Visit: Payer: Self-pay | Admitting: Obstetrics

## 2020-10-25 DIAGNOSIS — Z3041 Encounter for surveillance of contraceptive pills: Secondary | ICD-10-CM

## 2020-10-30 ENCOUNTER — Other Ambulatory Visit: Payer: Self-pay

## 2020-10-30 ENCOUNTER — Other Ambulatory Visit: Payer: Self-pay | Admitting: Obstetrics

## 2020-10-30 DIAGNOSIS — Z3041 Encounter for surveillance of contraceptive pills: Secondary | ICD-10-CM

## 2020-10-30 MED ORDER — NORETHINDRONE 0.35 MG PO TABS: 1.0000 | ORAL_TABLET | Freq: Every day | ORAL | 1 refills | Status: DC

## 2020-10-30 NOTE — Progress Notes (Signed)
Rx sent for Birth Control pt must have annual to receive additional refills.

## 2020-12-05 ENCOUNTER — Ambulatory Visit (INDEPENDENT_AMBULATORY_CARE_PROVIDER_SITE_OTHER): Payer: No Typology Code available for payment source | Admitting: Advanced Practice Midwife

## 2020-12-05 ENCOUNTER — Encounter: Payer: Self-pay | Admitting: Advanced Practice Midwife

## 2020-12-05 ENCOUNTER — Other Ambulatory Visit: Payer: Self-pay

## 2020-12-05 VITALS — BP 142/104 | HR 80 | Ht 67.0 in | Wt 155.0 lb

## 2020-12-05 DIAGNOSIS — Z1239 Encounter for other screening for malignant neoplasm of breast: Secondary | ICD-10-CM

## 2020-12-05 DIAGNOSIS — N912 Amenorrhea, unspecified: Secondary | ICD-10-CM | POA: Diagnosis not present

## 2020-12-05 DIAGNOSIS — Z793 Long term (current) use of hormonal contraceptives: Secondary | ICD-10-CM

## 2020-12-05 DIAGNOSIS — Z01419 Encounter for gynecological examination (general) (routine) without abnormal findings: Secondary | ICD-10-CM

## 2020-12-05 DIAGNOSIS — F17200 Nicotine dependence, unspecified, uncomplicated: Secondary | ICD-10-CM | POA: Diagnosis not present

## 2020-12-05 NOTE — Progress Notes (Signed)
   Subjective:     Tiffany [name] is a 45 y.o. female here at [facility] for a routine exam.  Current complaints: no periods on POPs.  Personal health questionnaire reviewed: yes.  Do you have a primary care provider? No, was dropped by her practice when she lost her job and was unable to make payments. Do you feel safe at home? yes    Health Maintenance Due  Topic Date Due   COVID-19 Vaccine (1) Never done   INFLUENZA VACCINE  Never done     Risk factors for chronic health problems: Smoking: yes Alchohol/how much: occasional Pt BMI: Body mass index is 24.28 kg/m.   Gynecologic History No LMP recorded. (Menstrual status: Oral contraceptives). Contraception: oral progesterone-only contraceptive Last Pap: 2021. Results were: normal. Neg HPV Last mammogram: 2021. Results were: normal  Obstetric History OB History  Gravida Para Term Preterm AB Living  2 1   1 1     SAB IAB Ectopic Multiple Live Births  1            # Outcome Date GA Lbr Len/2nd Weight Sex Delivery Anes PTL Lv  2 SAB 08/25/06 [redacted]w[redacted]d         1 Preterm 03/11/94 [redacted]w[redacted]d  1 lb 5 oz (0.595 kg)     FD     The following portions of the patient's history were reviewed and updated as appropriate: allergies, current medications, past family history, past medical history, past social history, past surgical history, and problem list.  Review of Systems Pertinent items noted in HPI and remainder of comprehensive ROS otherwise negative.    Objective:   BP (!) 142/104   Pulse 80   Ht 5\' 7"  (1.702 m)   Wt 155 lb (70.3 kg)   BMI 24.28 kg/m  VS reviewed, nursing note reviewed,  Constitutional: well developed, well nourished, no distress HEENT: normocephalic CV: normal rate Pulm/chest wall: normal effort Breast Exam:  exam performed: right breast normal without mass, skin or nipple changes or axillary nodes, left breast normal with some fibrocystic changes, no skin or nipple changes or axillary nodes Abdomen:  soft Neuro: alert and oriented x 3 Skin: warm, dry Psych: affect normal Pelvic exam: SSE Deferred Bimanual exam: Cervix 0/long/high, firm, anterior, neg CMT, uterus nontender, nonenlarged, adnexa without tenderness, enlargement, or mass       Assessment/Plan:   1. Well woman exam with routine gynecological exam --Overall doing well. Discussed Pap recommendations based on cervical cancer risk and pt would prefer to repeat Pap in 3 years, in 2024.     2. Encounter for screening for malignant neoplasm of breast, unspecified screening modality  - MM DIGITAL SCREENING BILATERAL; Future  3. Smoker --Discussed cutting back or quitting as beneficial for long term health. Printed information given.  4. Amenorrhea due to oral contraceptive --Pt amenorrheic with POPs x several years. Does have spotting if she misses pills for several days, otherwise no bleeding. --Likely physiologic r/t thinning of uterine lining with progesterone. --No risk to continuing, as pt had painful periods and breakthrough bleeding before POPs. --Discussed other forms of progesterone contraceptive, including IUD, Nexplanon, and Depo. Pt to continue pills at this time.   Follow up in: 1  year  or as needed.   Fatima Blank, CNM 9:12 AM

## 2020-12-05 NOTE — Progress Notes (Signed)
Patient presents for Annual Exam.  LMP:  Last Pap:07/01/19 WNL  STD Screening: Declines  Mammogram: 08/26/19 WNL  Family Hx of Breast Cancer: None  Contraception : Pills    CC: Wants to discuss contraception and no cycles states she has been on OCP for yrs.

## 2020-12-05 NOTE — Patient Instructions (Signed)
[place] ([employer] [address] [place], [place] [address] [phone] Mon-Fri 8:30-12:30, 1:30-5:00 Accepting [facility] at [place], [place], [place] [address] [phone] Mon-Fri 8:00-5:30 [place] [facility] [address], [place], [place] [address] [phone] Mechele Dawley, Thur, Fri 8:30-5:00, Wed 10:00-7:00 (closed 1-2pm) Accepting [facility] [place]. [address], [place], [place]  [address] Phone - [phone]   Fax - [phone]  [place] [phone]) [facility] [address] [place], [place] [address] [phone] Mon-Fri 8:00-5:00 [facility] at [facility])  [address] Barbara Cower [place], [place] [address] [phone] Mon-Fri 8:30-5:30, Sat (Oct.-Mar.) 9:00-1:00 Accepting Medicaid  [facility] [place], [place], [place] [address] Phone: [phone]  [place] [phone]) [place] at [place], [place], [place] [address] [phone] Mon-Fri 8:00-5:00  [place] [phone]) [place] at [place], [place], [place] [address] [phone] Mon-Fri 8:00-5:00 [place] at [place], [place], [place] [address] [phone] Mon-Fri 8:00-5:00 [place] at [facility] [address] Madelaine Bhat [place], [place] [address] [phone] Mon-Fri 8:00-5:00 [place] [place] [address] [phone] Mon-Fri 7:30-5:30  [place] [phone] & [phone]) [facility] [address], [place], [place] [address] [phone] Mon-Thur 8:00-6:00 Accepting Medicaid [place] [address] Madelaine Bhat [place], [place] [address] [phone] Mon-Thur 7:30-7:30, Fri 7:30-4:30 Accepting [facility] at [place]. [address], [place], [place]  [address] [phone]   Fax - [place] [phone] & [phone]) [place] at [place]., [place], [place] [address] [phone] Mon-Fri 7:00-5:00 [place] [address], [place], [place] [address] [phone] Mon-Fri 8:00-5:00 Accepting Medicaid [place] [address] [place], [place], [place] [address] [phone] Mon-Fri 8:00-5:00 Accepting Medicaid  [place] [phone]) [facility] at [facility] [address] Madelaine Bhat [place], [place] [address] [phone] Mon-Fri 8:00-5:00 [place] ([place] at [facility]) [address] [place], [place], [place] [address] [phone] Mon-Fri 8:00-5:00 Accepting Medicaid [place] ([facility] [employer]) [address] [place], [place], [place] [address] [phone] Mon-Fri 8:00-5:30, Sat&Sun by appointment (phones open at 8:30) Accepting [facility] [phone] & [phone]) [place] [address] [place], [place] [address] [phone] Mon-Thur 8:00-7:00, Fri 8:00-5:00, Sat 8:00-12:00, Sun 9:00-12:00 Accepting Medicaid [facility] at [facility] [address] [place], [place], [place] [address] [phone] Mon-Thur 8:00-5:00 Accepting Medicaid [facility] at [place]., [place], [place] [address] [phone] Mon-Fri 8:00-5:30, Sat (Oct.-Mar.) 9:00-1:00 Accepting [facility]  [place] [phone]) [place] [address] [place], [place], [place]  [address] [phone] Mon-Fri 8:00-5:00 Accepting [facility]   [place] [phone]) [place] at [facility] [address], [place], [place] [address] [phone] Mon-Fri 8:00-5:00 [place] at [facility] [address] Marolyn Hammock [place],  [address] [phone] Mon-Fri 8:00-5:00 [place] [address]. [address] [place] [place],  [address] [phone] Mon-Fri 8:00-5:00 After hours clinic [facility] [address] [place],  [place] [address]) [phone] Mon-Fri 5:00-8:00, Sat 12:00-6:00, Sun 10:00-4:00 Accepting Medicaid [facility] at [facility]. [address], [place], [place]  [address] [phone]   Fax - [phone]  [place] [phone]) [place] at [facility] [address] [place], [place], [place] [address] [phone] Mon-Fri 8:00-5:00 [place] ([place] at [place]) [address] [place], [place], [place] [address] [phone] Mon-Thur 8:00-7:00, Fri 8:00-5:00, Sat 8:00-12:00

## 2020-12-11 ENCOUNTER — Ambulatory Visit (HOSPITAL_BASED_OUTPATIENT_CLINIC_OR_DEPARTMENT_OTHER)
Admission: RE | Admit: 2020-12-11 | Payer: No Typology Code available for payment source | Source: Ambulatory Visit | Admitting: Radiology

## 2022-02-28 NOTE — Progress Notes (Signed)
GYNECOLOGY VISIT  46 y.o. G23P0110 Single African American female here for ACUTE NEW GYN. Pt has had a referral for here. She has a swollen lymph in her groin on the left side. Pt had BV positive 02/27/22.  Not treated yet.   She has had an enlarged left inguinal lymph node for one year.  It is painful when she stands up.  No other enlarged lymph nodes.  She has hx of HSV II.   Normal left hip x-ray.   Has menopausal symptoms.  She stopped progesterone only pills 4 months ago.  LH 53.7, FSH 79.6, E2 < 5 on 12/10/21 [facility] Elevated creatinine 1.13.   Her pelvic US showed fibroids.  She has had no further imaging studies. US Pelvis Transabdominal Transvaginal Without Doppler  Anatomical Region Laterality Modality  Pelvis -- Ultrasound  Vascular -- --   Impression  IMPRESSION: 1. Small intramuscular fibroids. 2. Mildly enlarged left inguinal node  Electronically Signed by: Caryl Never, MD on 01/11/2022 10:46 PM Narrative  This result has an attachment that is not available. INDICATION: Fibroids  TECHNIQUE: US PELVIS TRANSABDOMINAL TRANSVAGINAL WITHOUT DOPPLER.   Exam date/time: 01/11/2022 7:50 AM.  Comparison none.  FINDINGS: Uterus measures 6.9 x 3.5 x 4.0 cm. 3 small intramuscular fibroids measuring up to 1.6 cm. Endometrial stripe measures 3 mm. No free fluid. Right ovary measures 1.9 x 1.1 x 1.4 cm left ovary measures 1.5 x 1.1 x 0.7 cm. Normal flow is identified to both ovaries. there is an enlarged left inguinal node measuring 2.4 x 1.0 x 1.7 cm. Procedure Note  Caryl Never, MD - 01/11/2022 Formatting of this note might be different from the original. INDICATION: Fibroids  TECHNIQUE: US PELVIS TRANSABDOMINAL TRANSVAGINAL WITHOUT DOPPLER.   Exam date/time: 01/11/2022 7:50 AM.  Comparison none.  FINDINGS: Uterus measures 6.9 x 3.5 x 4.0 cm. 3 small intramuscular fibroids measuring up to 1.6 cm. Endometrial stripe measures 3 mm. No free fluid. Right ovary  measures 1.9 x 1.1 x 1.4 cm left ovary measures 1.5 x 1.1 x 0.7 cm. Normal flow is identified to both ovaries. there is an enlarged left inguinal node measuring 2.4 x 1.0 x 1.7 cm.  IMPRESSION: 1. Small intramuscular fibroids. 2. Mildly enlarged left inguinal node  Electronically Signed by: Caryl Never, MD on 01/11/2022 10:46 PM Resulting Agency Comment   PCP:   Junie Spencer, PA-C  No LMP recorded (lmp unknown). Patient is postmenopausal.           Sexually active: Yes.    The current method of family planning is condoms.    Exercising: No.     Smoker:  yes  Health Maintenance: Pap:  07/01/19 negative: HR HPV negative, 06/15/18 negative History of abnormal Pap:  yes, cryotherapy in her 20's MMG:  08/25/19, Breast Category B, BI-RADS CATEGORY 1 Negative.  [facility]?  Normal per patient.  Colonoscopy:  normal patient.  BMD:   n/a  Result  n/a TDaP:  unsure Gardasil:   unsure Screening Labs:  PCP Colonoscopy was done in 2023 and was normal.    reports that she has been smoking cigarettes. She has a 5.00 pack-year smoking history. She has never used smokeless tobacco. She reports current alcohol use of about 3.0 standard drinks of alcohol per week. She reports that she does not use drugs.  Past Medical History:  Diagnosis Date   Dysmenorrhea    Fibroids     Past Surgical History:  Procedure Laterality Date  BREAST BIOPSY     CESAREAN SECTION      Current Outpatient Medications  Medication Sig Dispense Refill   Biotin 5 MG TABS Take by mouth.     vitamin B-12 (CYANOCOBALAMIN) 1000 MCG tablet Take 1,000 mcg by mouth daily. Reported on 09/26/2015     metroNIDAZOLE (FLAGYL) 500 MG tablet Take 1 tablet (500 mg total) by mouth 2 (two) times daily. (Patient not taking: Reported on 03/13/2022) 14 tablet 0   [name] 0.35 MG tablet TAKE 1 TABLET BY MOUTH DAILY (Patient not taking: Reported on 03/13/2022) 28 tablet 0   norethindrone (MICRONOR) 0.35 MG tablet TAKE 1  TABLET(0.35 MG) BY MOUTH DAILY (Patient not taking: Reported on 03/13/2022) 84 tablet 3   pantoprazole (PROTONIX) 40 MG tablet Take 40 mg by mouth daily. (Patient not taking: Reported on 03/13/2022)     No current facility-administered medications for this visit.    Family History  Problem Relation Age of Onset   Hypertension Mother    Kidney disease Mother    Diabetes Maternal Aunt    Colon cancer Maternal Aunt    Hypertension Maternal Grandmother    Breast cancer Neg Hx     Review of Systems  All other systems reviewed and are negative.   Exam:   BP 126/80 (BP Location: Left Arm, Patient Position: Sitting, Cuff Size: Normal)   Pulse 84   Ht 5' 8.5" (1.74 m)   Wt 174 lb (78.9 kg)   LMP  (LMP Unknown)   SpO2 99%   BMI 26.07 kg/m     General appearance: alert, cooperative and appears stated age Head: normocephalic, without obvious abnormality, atraumatic Neck: no adenopathy, supple, symmetrical, trachea midline and thyroid normal to inspection and palpation Lungs: clear to auscultation bilaterally Breasts: normal appearance, no masses or tenderness, No nipple retraction or dimpling, No nipple discharge or bleeding, No axillary adenopathy Heart: regular rate and rhythm Abdomen: Pfannensteil incision, Abdomen is soft, non-tender; no masses, no organomegaly Extremities: extremities normal, atraumatic, no cyanosis or edema Skin: skin color, texture, turgor normal. No rashes or lesions Lymph nodes: cervical, supraclavicular, and axillary nodes normal. Neurologic: grossly normal  Pelvic: External genitalia:  left inguinal node 2.5 cm.              No abnormal inguinal nodes palpated.              Urethra:  normal appearing urethra with no masses, tenderness or lesions              Bartholins and Skenes: normal                 Vagina: normal appearing vagina with normal color and discharge, no lesions              Cervix: no lesions              Pap taken: yes Bimanual Exam:   Uterus:  normal size, contour, position, consistency, mobility, non-tender              Adnexa: no mass, fullness, tenderness              Rectal exam: yes.  Confirms.              Anus:  normal sphincter tone, no lesions  Chaperone was present for exam:  Raquel Sarna  Assessment:   Left inguinal adenopathy.  Cervical cancer screening.  Recent BV.  Untreated.   Plan: Mammogram screening discussed.  She will update her  mammogram if she finds it was not done in the the last 12 months.  Self breast awareness reviewed. Pap and HR HPV collected. Rx for Metrogel.  Instructed in use.  CBC with differential.  We discussed potential etiologies of lymph adenopathy.  Referral to Dr. Marin Olp, medical oncology. Follow up annually and prn.  After visit summary provided.   40 min  total time was spent for this patient encounter, including preparation, face-to-face counseling with the patient, coordination of care, and documentation of the encounter.

## 2022-03-13 ENCOUNTER — Other Ambulatory Visit (HOSPITAL_COMMUNITY)
Admission: RE | Admit: 2022-03-13 | Discharge: 2022-03-13 | Disposition: A | Discharge status: Home / Self Care | Payer: 59 | Source: Ambulatory Visit | Attending: Obstetrics and Gynecology | Admitting: Obstetrics and Gynecology

## 2022-03-13 ENCOUNTER — Encounter: Payer: Self-pay | Admitting: Obstetrics and Gynecology

## 2022-03-13 ENCOUNTER — Other Ambulatory Visit: Payer: Self-pay | Admitting: Obstetrics and Gynecology

## 2022-03-13 ENCOUNTER — Inpatient Hospital Stay (HOSPITAL_COMMUNITY): Admit: 2022-03-13 | Payer: Self-pay

## 2022-03-13 ENCOUNTER — Ambulatory Visit (INDEPENDENT_AMBULATORY_CARE_PROVIDER_SITE_OTHER): Payer: No Typology Code available for payment source | Admitting: Obstetrics and Gynecology

## 2022-03-13 VITALS — BP 126/80 | HR 84 | Ht 68.5 in | Wt 174.0 lb

## 2022-03-13 DIAGNOSIS — Z1231 Encounter for screening mammogram for malignant neoplasm of breast: Secondary | ICD-10-CM

## 2022-03-13 DIAGNOSIS — Z124 Encounter for screening for malignant neoplasm of cervix: Secondary | ICD-10-CM | POA: Insufficient documentation

## 2022-03-13 DIAGNOSIS — R590 Localized enlarged lymph nodes: Secondary | ICD-10-CM | POA: Diagnosis not present

## 2022-03-13 DIAGNOSIS — B9689 Other specified bacterial agents as the cause of diseases classified elsewhere: Secondary | ICD-10-CM | POA: Diagnosis not present

## 2022-03-13 DIAGNOSIS — N760 Acute vaginitis: Secondary | ICD-10-CM

## 2022-03-13 LAB — CBC WITH DIFFERENTIAL/PLATELET
Absolute Monocytes: 449 cells/uL (ref 200–950)
Basophils Absolute: 61 cells/uL (ref 0–200)
Basophils Relative: 0.9 %
Eosinophils Absolute: 129 cells/uL (ref 15–500)
Eosinophils Relative: 1.9 %
HCT: 41.7 % (ref 35.0–45.0)
Hemoglobin: 14.3 g/dL (ref 11.7–15.5)
Lymphs Abs: 2530 cells/uL (ref 850–3900)
MCH: 33.1 pg — ABNORMAL HIGH (ref 27.0–33.0)
MCHC: 34.3 g/dL (ref 32.0–36.0)
MCV: 96.5 fL (ref 80.0–100.0)
MPV: 9.9 fL (ref 7.5–12.5)
Monocytes Relative: 6.6 %
Neutro Abs: 3631 cells/uL (ref 1500–7800)
Neutrophils Relative %: 53.4 %
Platelets: 294 10*3/uL (ref 140–400)
RBC: 4.32 10*6/uL (ref 3.80–5.10)
RDW: 12.9 % (ref 11.0–15.0)
Total Lymphocyte: 37.2 %
WBC: 6.8 10*3/uL (ref 3.8–10.8)

## 2022-03-13 MED ORDER — METRONIDAZOLE 0.75 % VA GEL: VAGINAL | 0 refills | Status: DC

## 2022-03-14 ENCOUNTER — Encounter: Payer: Self-pay | Admitting: *Deleted

## 2022-03-14 NOTE — Progress Notes (Signed)
Reached out to Natasha Bence to introduce myself as the office RN Navigator and explain our new patient process. Reviewed the reason for their referral and scheduled their new patient appointment along with labs. Provided address and directions to the office including call back phone number. Reviewed with patient any concerns they may have or any possible barriers to attending their appointment.   Informed patient about my role as a navigator and that I will meet with them prior to their New Patient appointment and more fully discuss what services I can provide. At this time patient has no further questions or needs.    Oncology Nurse Navigator Documentation     03/14/2022   10:00 AM  Oncology Nurse Navigator Flowsheets  Abnormal Finding Date 03/13/2022  Navigator Follow Up Date: 03/20/2022  Navigator Follow Up Reason: New Patient Appointment  Navigator Location [facility]  Referral Date to RadOnc/MedOnc 03/13/2022  Navigator Encounter Type Introductory Phone Call  Patient Visit Type MedOnc  Treatment Phase Abnormal Scans  Barriers/Navigation Needs Coordination of Care;Education  Education Other  Interventions Coordination of Care;Education  Acuity Level 2-Minimal Needs (1-2 Barriers Identified)  Education Method Verbal;Teach-back  Time Spent with Patient 30

## 2022-03-15 ENCOUNTER — Encounter: Payer: Self-pay | Admitting: *Deleted

## 2022-03-15 LAB — CYTOLOGY - PAP
Comment: NEGATIVE
Diagnosis: NEGATIVE
High risk HPV: POSITIVE — AB

## 2022-03-17 ENCOUNTER — Encounter: Payer: Self-pay | Admitting: Obstetrics and Gynecology

## 2022-03-20 ENCOUNTER — Encounter: Payer: Self-pay | Admitting: Hematology & Oncology

## 2022-03-20 ENCOUNTER — Inpatient Hospital Stay (HOSPITAL_BASED_OUTPATIENT_CLINIC_OR_DEPARTMENT_OTHER): Payer: No Typology Code available for payment source | Admitting: Hematology & Oncology

## 2022-03-20 ENCOUNTER — Inpatient Hospital Stay: Payer: No Typology Code available for payment source | Attending: Hematology & Oncology

## 2022-03-20 ENCOUNTER — Encounter: Payer: Self-pay | Admitting: *Deleted

## 2022-03-20 VITALS — BP 159/99 | HR 102 | Temp 98.9°F | Resp 17 | Ht 67.5 in | Wt 178.8 lb

## 2022-03-20 DIAGNOSIS — R590 Localized enlarged lymph nodes: Secondary | ICD-10-CM

## 2022-03-20 DIAGNOSIS — F17210 Nicotine dependence, cigarettes, uncomplicated: Secondary | ICD-10-CM | POA: Diagnosis not present

## 2022-03-20 DIAGNOSIS — Z79899 Other long term (current) drug therapy: Secondary | ICD-10-CM | POA: Diagnosis not present

## 2022-03-20 DIAGNOSIS — R599 Enlarged lymph nodes, unspecified: Secondary | ICD-10-CM

## 2022-03-20 LAB — CBC WITH DIFFERENTIAL (CANCER CENTER ONLY)
Abs Immature Granulocytes: 0.01 10*3/uL (ref 0.00–0.07)
Basophils Absolute: 0.1 10*3/uL (ref 0.0–0.1)
Basophils Relative: 1 %
Eosinophils Absolute: 0.1 10*3/uL (ref 0.0–0.5)
Eosinophils Relative: 2 %
HCT: 42.6 % (ref 36.0–46.0)
Hemoglobin: 14 g/dL (ref 12.0–15.0)
Immature Granulocytes: 0 %
Lymphocytes Relative: 29 %
Lymphs Abs: 1.9 10*3/uL (ref 0.7–4.0)
MCH: 32.4 pg (ref 26.0–34.0)
MCHC: 32.9 g/dL (ref 30.0–36.0)
MCV: 98.6 fL (ref 80.0–100.0)
Monocytes Absolute: 0.6 10*3/uL (ref 0.1–1.0)
Monocytes Relative: 10 %
Neutro Abs: 3.9 10*3/uL (ref 1.7–7.7)
Neutrophils Relative %: 58 %
Platelet Count: 273 10*3/uL (ref 150–400)
RBC: 4.32 MIL/uL (ref 3.87–5.11)
RDW: 13.4 % (ref 11.5–15.5)
WBC Count: 6.6 10*3/uL (ref 4.0–10.5)
nRBC: 0 % (ref 0.0–0.2)

## 2022-03-20 LAB — LACTATE DEHYDROGENASE: LDH: 136 U/L (ref 98–192)

## 2022-03-20 LAB — CMP (CANCER CENTER ONLY)
ALT: 18 U/L (ref 0–44)
AST: 18 U/L (ref 15–41)
Albumin: 4.6 g/dL (ref 3.5–5.0)
Alkaline Phosphatase: 64 U/L (ref 38–126)
Anion gap: 7 (ref 5–15)
BUN: 13 mg/dL (ref 6–20)
CO2: 30 mmol/L (ref 22–32)
Calcium: 10 mg/dL (ref 8.9–10.3)
Chloride: 103 mmol/L (ref 98–111)
Creatinine: 0.98 mg/dL (ref 0.44–1.00)
GFR, Estimated: >60 mL/min (ref >60)
Glucose, Bld: 102 mg/dL — ABNORMAL HIGH (ref 70–99)
Potassium: 4 mmol/L (ref 3.5–5.1)
Sodium: 140 mmol/L (ref 135–145)
Total Bilirubin: 0.6 mg/dL (ref 0.3–1.2)
Total Protein: 7.6 g/dL (ref 6.5–8.1)

## 2022-03-20 LAB — SAVE SMEAR(SSMR), FOR PROVIDER SLIDE REVIEW

## 2022-03-20 NOTE — Progress Notes (Signed)
Referral MD  Reason for Referral: Left inguinal adenopathy  Chief Complaint  Patient presents with   New Patient (Initial Visit)  : I have a swollen lymph node in my groin.  HPI: Tiffany [name] is a very charming 47 year old Afro-American female.  She has a 35 year old son.  She is working.  She is followed closely by Dr. Rolly Pancake of Gynecology.  She has had problems with fibroids.  She has had some dysmenorrhea.  She is on contraceptives to help with her cycles.  Recently, she saw Dr. Quincy Simmonds.  Dr. Quincy Simmonds did some studies because of some pelvic pain.  She did have a ultrasound that was done.  This was done on 01/11/2022.  This showed some small fibroids.  However, there was a enlarged left inguinal lymph node.  This measured 2.4 x 1 x 1.7 cm.  Because of this, Tiffany [name] was referred to the [facility] for an evaluation.  She can feel the lymph node.  On occasion she has some pain in the left thigh.  She has had no problems with weight loss.  There is been no change in bowel or bladder habits.  She has not yet had a mammogram.  She does have a family history of colon cancer.  She is not a vegetarian.  She has had no nausea or vomiting.  She does not smoke.  She has a limited tobacco use history.  She probably has may be a 15-pack-year history of tobacco use.  She has not noted any swollen lymph nodes elsewhere.  There is been no cough or shortness of breath.  She has had no rashes.  There is been no pruritus.  She has had no leg swelling.  There is been no CT scans that have been done to further evaluate this lymph node.  She does not have a history of sickle cell.  There is no history of sarcoid.  There is no history of an autoimmune disorder.  Overall, I would say performance status is probably ECOG 1.    Past Medical History:  Diagnosis Date   Dysmenorrhea    Fibroids    HSV infection    Inguinal adenopathy    left  :   Past Surgical History:   Procedure Laterality Date   BREAST BIOPSY     CESAREAN SECTION    :   Current Outpatient Medications:    Biotin 5 MG TABS, Take by mouth., Disp: , Rfl:    metroNIDAZOLE (METROGEL) 0.75 % vaginal gel, Place applicator of medication in the vagina at bedtime for 5 nights., Disp: 70 g, Rfl: 0   vitamin B-12 (CYANOCOBALAMIN) 1000 MCG tablet, Take 1,000 mcg by mouth daily. Reported on 09/26/2015, Disp: , Rfl:    [name] 0.35 MG tablet, TAKE 1 TABLET BY MOUTH DAILY (Patient not taking: Reported on 03/13/2022), Disp: 28 tablet, Rfl: 0   norethindrone (MICRONOR) 0.35 MG tablet, TAKE 1 TABLET(0.35 MG) BY MOUTH DAILY (Patient not taking: Reported on 03/13/2022), Disp: 84 tablet, Rfl: 3   pantoprazole (PROTONIX) 40 MG tablet, Take 40 mg by mouth daily. (Patient not taking: Reported on 03/13/2022), Disp: , Rfl: :  :  No Known Allergies:   Family History  Problem Relation Age of Onset   Hypertension Mother    Kidney disease Mother    Diabetes Maternal Aunt    Colon cancer Maternal Aunt    Hypertension Maternal Grandmother    Breast cancer Neg Hx   :   Social  History   Socioeconomic History   Marital status: Single    Spouse name: Not on file   Number of children: 1   Years of education: 28   Highest education level: Not on file  Occupational History   Occupation: Chemical engineer  Tobacco Use   Smoking status: Every Day    Packs/day: 0.50    Years: 10.00    Total pack years: 5.00    Types: Cigarettes   Smokeless tobacco: Never  Vaping Use   Vaping Use: Never used  Substance and Sexual Activity   Alcohol use: Yes    Alcohol/week: 3.0 standard drinks of alcohol    Types: 3 Shots of liquor per week    Comment: couple days out of the week   Drug use: No   Sexual activity: Yes    Partners: Male    Birth control/protection: Condom  Other Topics Concern   Not on file  Social History Narrative   Fun: Watching tv   Denies any religious beliefs effecting health care.    Social  Determinants of Health   Financial Resource Strain: Not on file  Food Insecurity: Not on file  Transportation Needs: Not on file  Physical Activity: Not on file  Stress: Not on file  Social Connections: Not on file  Intimate Partner Violence: Not on file  :  Review of Systems  Constitutional: Negative.   HENT: Negative.    Eyes: Negative.   Respiratory: Negative.    Cardiovascular: Negative.   Gastrointestinal: Negative.   Genitourinary: Negative.   Musculoskeletal: Negative.   Skin: Negative.   Neurological: Negative.   Endo/Heme/Allergies: Negative.   Psychiatric/Behavioral: Negative.       Exam: Temperature: 98.9.  Pulse 102.  Blood pressure 159/99.  Weight is 178 pounds.    Physical Exam Vitals reviewed.  HENT:     Head: Normocephalic and atraumatic.  Eyes:     Pupils: Pupils are equal, round, and reactive to light.  Cardiovascular:     Rate and Rhythm: Normal rate and regular rhythm.     Heart sounds: Normal heart sounds.  Pulmonary:     Effort: Pulmonary effort is normal.     Breath sounds: Normal breath sounds.  Abdominal:     General: Bowel sounds are normal.     Palpations: Abdomen is soft.     Comments: Abdominal exam shows a soft abdomen.  She has good bowel sounds.  There is no fluid wave.  There is no obvious abdominal mass.  There is is a palpable lymph node in the left inguinal region.  It is nontender.  It might be mobile.  It probably measures about 2 cm.  There is no splenomegaly.  Musculoskeletal:        General: No tenderness or deformity. Normal range of motion.     Cervical back: Normal range of motion.  Lymphadenopathy:     Cervical: No cervical adenopathy.  Skin:    General: Skin is warm and dry.     Findings: No erythema or rash.  Neurological:     Mental Status: She is alert and oriented to person, place, and time.  Psychiatric:        Behavior: Behavior normal.        Thought Content: Thought content normal.        Judgment:  Judgment normal.     Recent Labs    03/20/22 1351  WBC 6.6  HGB 14.0  HCT 42.6  PLT 273  Recent Labs    03/20/22 1351  NA 140  K 4.0  CL 103  CO2 30  GLUCOSE 102*  BUN 13  CREATININE 0.98  CALCIUM 10.0    Blood smear review: Normochromic and normocytic population of red blood cells.  There are no nucleated red blood cells.  I see no rouleaux formation.  White blood cells appear normal in morphology maturation.  There is no immature myeloid or lymphoid forms.  Platelets are adequate in number and size.    Pathology: None    Assessment and Plan: Tiffany [name] is a very charming 47 year old African-American woman.  She has a lymph node in the left inguinal region.  It is hard to say if this is anything significant.  We are going to get a CT scan of her body.  This way, we can see if there is any lymphadenopathy elsewhere.  This pain in her left thigh and knee would not be coming from this lymph node in the inguinal area.  As such, we might see swollen lymph nodes along the spine.  If we do find that there are multiple enlarged lymph nodes, then I probably get a PET scan on her.  If there is no other adenopathy on the CT scan, then I would see if surgery might be able to resect out this lymph node for an evaluation.  In a young African-American woman, sarcoidosis is always a possibility.  Again, it is hard to say if there is any malignancy here.  I would like to hope that there is not any malignancy that we would have to worry about.  It was incredibly delightful talking with Tiffany [name].  She has a strong faith.  I gave her a prayer blanket.  She was grateful for this.  We will plan to get her back depending on what we find with the CT scan.

## 2022-03-20 NOTE — Progress Notes (Signed)
Initial RN Navigator Patient Visit  Name: Tiffany [name] Date of Referral : 03/13/2022 Diagnosis: Inguinal LN  Met with patient prior to their visit with MD. Hanley Seamen patient all the contact information for myself and the office. Also gave patient MD and Navigator business card. Reviewed with patient the general overview of expected course after initial diagnosis and time frame for all steps to be completed.  Patient has a single enlarged LN that has been present for "About three years". Since suspicion for malignancy is low, cancer education not provided.  She works full time. Lives alone. Has multiple supportive people who she can depend on if needed for transportation and support.   Patient completed visit with Dr. Marin Olp.   Once Dr Marin Olp dictates his note and places orders, will begin on scheduling.   Patient understands all follow up procedures and expectations. They have my number to reach out for any further clarification or additional needs.   Oncology Nurse Navigator Documentation     03/20/2022    2:00 PM  Oncology Nurse Navigator Flowsheets  Navigator Follow Up Date: 03/21/2022  Navigator Follow Up Reason: Appointment Review  Navigator Location [facility]  Navigator Encounter Type Initial MedOnc  Patient Visit Type MedOnc  Treatment Phase Abnormal Scans  Barriers/Navigation Needs Coordination of Care;Education  Education Other  Interventions Education;Psycho-Social Support  Acuity Level 2-Minimal Needs (1-2 Barriers Identified)  Education Method Verbal  Support Groups/Services Friends and Family  Time Spent with Patient 15

## 2022-03-21 ENCOUNTER — Encounter: Payer: Self-pay | Admitting: *Deleted

## 2022-03-21 ENCOUNTER — Other Ambulatory Visit: Payer: Self-pay

## 2022-03-21 DIAGNOSIS — R87810 Cervical high risk human papillomavirus (HPV) DNA test positive: Secondary | ICD-10-CM

## 2022-03-21 LAB — BETA 2 MICROGLOBULIN, SERUM: Beta-2 Microglobulin: 1.8 mg/L (ref 0.6–2.4)

## 2022-03-21 NOTE — Progress Notes (Signed)
Patient will need a CT scan to determine if LN is isolated or if there is more adenopathy present. Next steps will be determined by results. CT scheduled for 03/25/2022.  Spoke to patient. She is aware of CT and knows we will call her once we receive results.   Oncology Nurse Navigator Documentation     03/21/2022    9:00 AM  Oncology Nurse Navigator Flowsheets  Navigator Follow Up Date: 03/26/2022  Navigator Follow Up Reason: Scan Review  Navigator Location [facility]  Navigator Encounter Type Appt/Treatment Plan Review;Telephone  Telephone Outgoing Call  Patient Visit Type MedOnc  Treatment Phase Abnormal Scans  Barriers/Navigation Needs Coordination of Care;Education  Education Other  Interventions Education  Acuity Level 2-Minimal Needs (1-2 Barriers Identified)  Support Groups/Services Friends and Family  Time Spent with Patient 15

## 2022-03-25 ENCOUNTER — Encounter: Payer: Self-pay | Admitting: *Deleted

## 2022-03-25 ENCOUNTER — Encounter (HOSPITAL_BASED_OUTPATIENT_CLINIC_OR_DEPARTMENT_OTHER): Payer: Self-pay

## 2022-03-25 ENCOUNTER — Ambulatory Visit (HOSPITAL_BASED_OUTPATIENT_CLINIC_OR_DEPARTMENT_OTHER): Payer: No Typology Code available for payment source

## 2022-03-25 NOTE — Progress Notes (Signed)
Received a call from the patient stating her CT had been cancelled due to lack of insurance authorization.   Called and spoke to insurance specialist. She states that the insurance request more information over the weekend, and that information was sent to them this morning at 9:30a.   Notified the patient that insurance was requiring more information and that it was provided. We would call her as soon as insurance authorization was received and CT rescheduled.   Oncology Nurse Navigator Documentation     03/25/2022   11:30 AM  Oncology Nurse Navigator Flowsheets  Navigator Location [facility]  Navigator Encounter Type Telephone  Telephone Appt Confirmation/Clarification;Incoming Call  Patient Visit Type MedOnc  Treatment Phase Abnormal Scans  Barriers/Navigation Needs Coordination of Care;Education  Education Other  Interventions Coordination of Care;Education  Acuity Level 2-Minimal Needs (1-2 Barriers Identified)  Coordination of Care Other  Education Method Verbal  Support Groups/Services Friends and Family  Time Spent with Patient 14

## 2022-03-28 ENCOUNTER — Encounter: Payer: Self-pay | Admitting: *Deleted

## 2022-03-28 NOTE — Progress Notes (Signed)
Patient called seeking update on CT scans. Her insurance has denied the complete request and an appeal was sent yesterday morning (03/27/2022). Reviewed this process with the patient. Explained to her that we would continue trying and once a final response was received we would reach out to her with result. She was appreciate for the information.   Oncology Nurse Navigator Documentation     03/28/2022    9:15 AM  Oncology Nurse Navigator Flowsheets  Navigator Location [facility]  Navigator Encounter Type Telephone  Telephone Appt Confirmation/Clarification;Incoming Call  Patient Visit Type MedOnc  Treatment Phase Abnormal Scans  Barriers/Navigation Needs Coordination of Care;Education  Education Other  Interventions Coordination of Care;Education  Acuity Level 2-Minimal Needs (1-2 Barriers Identified)  Coordination of Care Other  Education Method Verbal  Support Groups/Services Friends and Family  Time Spent with Patient 15

## 2022-04-05 ENCOUNTER — Encounter: Payer: Self-pay | Admitting: *Deleted

## 2022-04-05 DIAGNOSIS — R590 Localized enlarged lymph nodes: Secondary | ICD-10-CM

## 2022-04-05 DIAGNOSIS — R599 Enlarged lymph nodes, unspecified: Secondary | ICD-10-CM

## 2022-04-05 NOTE — Progress Notes (Unsigned)
Insurance has approved the CT AP, but not CT chest. Called patient and told her that we would move forward with the CT of AP and gave her the number to call for scheduling. She was thankful for the information and was going to call to schedule.  Oncology Nurse Navigator Documentation     04/05/2022    3:30 PM  Oncology Nurse Navigator Flowsheets  Navigator Follow Up Date: 04/09/2022  Navigator Follow Up Reason: Scan Review  Navigator Location [facility]  Navigator Encounter Type Telephone  Telephone Outgoing Call  Patient Visit Type MedOnc  Treatment Phase Abnormal Scans  Barriers/Navigation Needs Coordination of Care;Education  Education Other  Interventions Coordination of Care;Education  Acuity Level 2-Minimal Needs (1-2 Barriers Identified)  Coordination of Care Other  Education Method Verbal  Support Groups/Services Friends and Family  Time Spent with Patient 15

## 2022-04-08 ENCOUNTER — Ambulatory Visit (HOSPITAL_BASED_OUTPATIENT_CLINIC_OR_DEPARTMENT_OTHER): Payer: No Typology Code available for payment source

## 2022-04-08 NOTE — Progress Notes (Signed)
GYNECOLOGY  VISIT   HPI: 47 y.o.   Single  African American  female   [id number] with No LMP recorded (lmp unknown). Patient is postmenopausal.   here for colposcopy.  Her pap showed positive HR HPV and the cells were normal.   She has seen oncology for left inguinal adenopathy.  She is asking to review the CT report.  CT showed bilateral inguinal adenopathy, which is thought to be reactive.   Has some nonhealing sores between her buttocks.  States she has been told these are cystic lesions.  She has seen dermatology but has not had follow up.   UPT today - negative.   GYNECOLOGIC HISTORY: No LMP recorded (lmp unknown). Patient is postmenopausal. Contraception:  postmenopausal Menopausal hormone therapy:  n/a Last mammogram:  08/25/19 Last pap smear:   03/13/22 neg: HR HPV positive, 07/01/19 neg: HR HPV neg        OB History     Gravida  2   Para  1   Term      Preterm  1   AB  1   Living         SAB  1   IAB      Ectopic      Multiple      Live Births                 Patient Active Problem List   Diagnosis Date Noted   Amenorrhea due to oral contraceptive 12/05/2020   Family history of colon cancer 99991111   Folliculitis 99991111   Excessive sweating 09/26/2015   Open wound of face 07/07/2014   Current smoker 05/18/2014   Genital herpes 05/11/2013    Past Medical History:  Diagnosis Date   Dysmenorrhea    Fibroids    HSV infection    Inguinal adenopathy    left    Past Surgical History:  Procedure Laterality Date   BREAST BIOPSY     CESAREAN SECTION      Current Outpatient Medications  Medication Sig Dispense Refill   Biotin 5 MG TABS Take by mouth.     metroNIDAZOLE (METROGEL) 0.75 % vaginal gel Place applicator of medication in the vagina at bedtime for 5 nights. 70 g 0   vitamin B-12 (CYANOCOBALAMIN) 1000 MCG tablet Take 1,000 mcg by mouth daily. Reported on 09/26/2015     No current facility-administered medications for this  visit.     ALLERGIES: Patient has no known allergies.  Family History  Problem Relation Age of Onset   Hypertension Mother    Kidney disease Mother    Diabetes Maternal Aunt    Colon cancer Maternal Aunt    Hypertension Maternal Grandmother    Breast cancer Neg Hx     Social History   Socioeconomic History   Marital status: Single    Spouse name: Not on file   Number of children: 1   Years of education: 16   Highest education level: Not on file  Occupational History   Occupation: Chemical engineer  Tobacco Use   Smoking status: Every Day    Packs/day: 0.50    Years: 10.00    Total pack years: 5.00    Types: Cigarettes   Smokeless tobacco: Never  Vaping Use   Vaping Use: Never used  Substance and Sexual Activity   Alcohol use: Yes    Alcohol/week: 3.0 standard drinks of alcohol    Types: 3 Shots of liquor per week  Comment: couple days out of the week   Drug use: No   Sexual activity: Yes    Partners: Male    Birth control/protection: Condom  Other Topics Concern   Not on file  Social History Narrative   Fun: Watching tv   Denies any religious beliefs effecting health care.    Social Determinants of Health   Financial Resource Strain: Not on file  Food Insecurity: Not on file  Transportation Needs: Not on file  Physical Activity: Not on file  Stress: Not on file  Social Connections: Not on file  Intimate Partner Violence: Not on file    Review of Systems  All other systems reviewed and are negative.   PHYSICAL EXAMINATION:    BP 124/80 (BP Location: Left Arm, Patient Position: Sitting, Cuff Size: Normal)   Ht 5' 8.5" (1.74 m)   Wt 174 lb (78.9 kg)   LMP  (LMP Unknown)   BMI 26.07 kg/m     General appearance: alert, cooperative and appears stated age  Colposcopy - cervix, vagina. Consent for procedure.  3% acetic acid used in vagina. White light and green light filter used.  Colposcopy satisfactory:  Yes   __x___          No     _____ Findings:  no lesions of cervix or vagina.   Os appears somewhat stenotic.  ECC not possible.  Hibiclens prep to the cervix.  Tenaculum to anterior cervix.  Os finder used.  ECC performed.  Tissue to pathology. Minimal EBL. No complications.   Buttocks:  gluteal fold with 2 ulcerative lesions - 3 - 5 mm each.   Chaperone was present for exam:  Kimalexis.   ASSESSMENT  Positive HR HPV.  Left inguinal adenopathy with bilateral inguinal adenopathy on CT scan.  Ulcerative skin lesions of gluteal fold.  Hx HSV.  Tobacco use.   PLAN  Fu ECC.  Final plan to follow.  Smoking cessation recommended.  Gardasil vaccine initiated today. CT scan result reviewed. She will follow up with Dr. Marin Olp for recommendations for the inguinal adenopathy.  She will follow up with dermatology for the buttock lesions.    An After Visit Summary was printed and given to the patient.  20 min  total time was spent for this patient encounter, including preparation, face-to-face counseling with the patient, coordination of care, and documentation of the encounter in addition to doing coloscopy with biopsy.

## 2022-04-09 ENCOUNTER — Ambulatory Visit (HOSPITAL_BASED_OUTPATIENT_CLINIC_OR_DEPARTMENT_OTHER)
Admission: RE | Admit: 2022-04-09 | Discharge: 2022-04-09 | Disposition: A | Discharge status: Home / Self Care | Payer: 59 | Source: Ambulatory Visit | Attending: Hematology & Oncology | Admitting: Hematology & Oncology

## 2022-04-09 ENCOUNTER — Encounter (HOSPITAL_BASED_OUTPATIENT_CLINIC_OR_DEPARTMENT_OTHER): Payer: Self-pay

## 2022-04-09 DIAGNOSIS — R590 Localized enlarged lymph nodes: Secondary | ICD-10-CM | POA: Diagnosis present

## 2022-04-09 DIAGNOSIS — R599 Enlarged lymph nodes, unspecified: Secondary | ICD-10-CM | POA: Diagnosis present

## 2022-04-09 MED ORDER — IOHEXOL 300 MG/ML  SOLN
100.0000 mL | Freq: Once | INTRAMUSCULAR | Status: AC | PRN
  Administered 2022-04-09: 100 mL via INTRAVENOUS

## 2022-04-10 ENCOUNTER — Encounter: Payer: Self-pay | Admitting: *Deleted

## 2022-04-10 NOTE — Progress Notes (Signed)
CT scan results reviewed with Dr Marin Olp. There was no adenopathy large enough for a biopsy. At this time Dr Marin Olp would like to see patient in about 2 weeks and follow adenopathy along with imaging.   I called and reviewed findings with patient. Discussed with her the recommended follow up. At this time she would like to not schedule any follow up. She doesn't feel like she'll gain any further information from an MD visit and is satisfied with the CT interpretation. She will continue to follow up with her PCP and contact us if any further problems arise. Will discontinue active navigation at this time. Will be available for navigation in the future if needed.   Oncology Nurse Navigator Documentation     04/10/2022   10:45 AM  Oncology Nurse Navigator Flowsheets  Navigation Complete Date: 04/10/2022  Post Navigation: Continue to Follow Patient? No  Reason Not Navigating Patient: No Cancer Diagnosis  Navigator Location [facility]  Navigator Encounter Type Scan Review;Telephone  Telephone Diagnostic Results;Outgoing Call  Patient Visit Type MedOnc  Treatment Phase Abnormal Scans  Barriers/Navigation Needs Coordination of Care;Education  Education Other  Interventions Education;Coordination of Care  Acuity Level 2-Minimal Needs (1-2 Barriers Identified)  Coordination of Care Appts  Education Method Verbal  Support Groups/Services Friends and Family  Time Spent with Patient 47

## 2022-04-22 ENCOUNTER — Ambulatory Visit (INDEPENDENT_AMBULATORY_CARE_PROVIDER_SITE_OTHER): Payer: No Typology Code available for payment source | Admitting: Obstetrics and Gynecology

## 2022-04-22 ENCOUNTER — Encounter: Payer: Self-pay | Admitting: Obstetrics and Gynecology

## 2022-04-22 ENCOUNTER — Other Ambulatory Visit (HOSPITAL_COMMUNITY)
Admission: RE | Admit: 2022-04-22 | Discharge: 2022-04-22 | Disposition: A | Discharge status: Home / Self Care | Payer: No Typology Code available for payment source | Source: Ambulatory Visit | Attending: Obstetrics and Gynecology | Admitting: Obstetrics and Gynecology

## 2022-04-22 VITALS — BP 124/80 | HR 82 | Ht 68.5 in | Wt 174.0 lb

## 2022-04-22 DIAGNOSIS — Z23 Encounter for immunization: Secondary | ICD-10-CM

## 2022-04-22 DIAGNOSIS — Z720 Tobacco use: Secondary | ICD-10-CM | POA: Diagnosis not present

## 2022-04-22 DIAGNOSIS — R87810 Cervical high risk human papillomavirus (HPV) DNA test positive: Secondary | ICD-10-CM | POA: Diagnosis present

## 2022-04-22 DIAGNOSIS — Z01812 Encounter for preprocedural laboratory examination: Secondary | ICD-10-CM

## 2022-04-22 DIAGNOSIS — L989 Disorder of the skin and subcutaneous tissue, unspecified: Secondary | ICD-10-CM

## 2022-04-22 LAB — PREGNANCY, URINE: Preg Test, Ur: NEGATIVE

## 2022-04-22 NOTE — Patient Instructions (Signed)
Colposcopy, Care After  The following information offers guidance on how to care for yourself after your procedure. Your health care provider may also give you more specific instructions. If you have problems or questions, contact your health care provider. What can I expect after the procedure? If you had a colposcopy without a biopsy, you can expect to feel fine right away after your procedure. However, you may have some spotting of blood for a few days. You can return to your normal activities. If you had a colposcopy with a biopsy, it is common after the procedure to have: Soreness and mild pain. These may last for a few days. Mild vaginal bleeding or discharge that is dark-colored and grainy. This may last for a few days. The discharge may be caused by a liquid (solution) that was used during the procedure. You may need to wear a sanitary pad during this time. Spotting of blood for at least 48 hours after the procedure. Follow these instructions at home: Medicines Take over-the-counter and prescription medicines only as told by your health care provider. Talk with your health care provider about what type of over-the-counter pain medicines and prescription medicines you can start to take again. It is especially important to talk with your health care provider if you take blood thinners. Activity Avoid using douche products, using tampons, and having sex for at least 3 days after the procedure or for as long as told by your health care provider. Return to your normal activities as told by your health care provider. Ask your health care provider what activities are safe for you. General instructions Ask your health care provider if you may take baths, swim, or use a hot tub. You may take showers. If you use birth control (contraception), continue to use it. Keep all follow-up visits. This is important. Contact a health care provider if: You have a fever or chills. You faint or feel  light-headed. Get help right away if: You have heavy bleeding from your vagina or pass blood clots. Heavy bleeding is bleeding that soaks through a sanitary pad in less than 1 hour. You have vaginal discharge that is abnormal, is yellow in color, or smells bad. This could be a sign of infection. You have severe pain or cramps in your lower abdomen that do not go away with medicine. Summary If you had a colposcopy without a biopsy, you can expect to feel fine right away, but you may have some spotting of blood for a few days. You can return to your normal activities. If you had a colposcopy with a biopsy, it is common to have mild pain for a few days and spotting for 48 hours after the procedure. Avoid using douche products, using tampons, and having sex for at least 3 days after the procedure or for as long as told by your health care provider. Get help right away if you have heavy bleeding, severe pain, or signs of infection. This information is not intended to replace advice given to you by your health care provider. Make sure you discuss any questions you have with your health care provider. Document Revised: 07/23/2020 Document Reviewed: 07/23/2020 Elsevier Patient Education  [employer].

## 2022-04-24 LAB — SURGICAL PATHOLOGY

## 2022-04-29 ENCOUNTER — Encounter: Payer: Self-pay | Admitting: Obstetrics and Gynecology

## 2022-05-03 ENCOUNTER — Ambulatory Visit
Admission: RE | Admit: 2022-05-03 | Discharge: 2022-05-03 | Disposition: A | Discharge status: Home / Self Care | Payer: No Typology Code available for payment source | Source: Ambulatory Visit | Attending: Obstetrics and Gynecology | Admitting: Obstetrics and Gynecology

## 2022-05-03 DIAGNOSIS — Z1231 Encounter for screening mammogram for malignant neoplasm of breast: Secondary | ICD-10-CM

## 2022-05-15 NOTE — Telephone Encounter (Signed)
I called patient and left detailed message that My Chart reply from Dr. Quincy Simmonds 05/01/22 returned unread. I asked her to login to read or to call the office and we will be happy to read it to her as well.

## 2022-06-24 ENCOUNTER — Ambulatory Visit (INDEPENDENT_AMBULATORY_CARE_PROVIDER_SITE_OTHER): Payer: 59 | Admitting: *Deleted

## 2022-06-24 DIAGNOSIS — Z23 Encounter for immunization: Secondary | ICD-10-CM | POA: Diagnosis not present

## 2022-06-24 NOTE — Progress Notes (Signed)
Her for 2nd Gardasil injection.  Patient is postmenopausal.  Injection given in right deltoid. Tolerated well. Next injection due 10/24/22.

## 2022-07-31 ENCOUNTER — Ambulatory Visit (HOSPITAL_COMMUNITY)
Admission: EM | Admit: 2022-07-31 | Discharge: 2022-07-31 | Disposition: A | Discharge status: Home / Self Care | Payer: 59 | Attending: Emergency Medicine | Admitting: Emergency Medicine

## 2022-07-31 ENCOUNTER — Ambulatory Visit (INDEPENDENT_AMBULATORY_CARE_PROVIDER_SITE_OTHER): Payer: 59

## 2022-07-31 ENCOUNTER — Encounter (HOSPITAL_COMMUNITY): Payer: Self-pay

## 2022-07-31 DIAGNOSIS — M79645 Pain in left finger(s): Secondary | ICD-10-CM

## 2022-07-31 HISTORY — DX: Essential (primary) hypertension: I10

## 2022-07-31 MED ORDER — IBUPROFEN 800 MG PO TABS: 800.0000 mg | ORAL_TABLET | Freq: Three times a day (TID) | ORAL | 0 refills | Status: DC

## 2022-07-31 NOTE — Discharge Instructions (Addendum)
The xray today is normal  I recommend trying either ibuprofen or Tylenol to reduce any inflammation internally.  You can also try ice, 10 to 15 minutes at a time.  Make sure to apply something between ice and the skin  Please follow-up with the orthopedic specialist if needed.

## 2022-07-31 NOTE — ED Provider Notes (Signed)
[facility]    CSN: [id number] Arrival date & time: 07/31/22  1807      History   Chief Complaint Chief Complaint  Patient presents with   left thumb problem    HPI Tiffany [name] is a 47 y.o. female.  Here with 2-3 week history of left thumb discomfort. Feels the joint clicks and locks every time she move it. Denies any known injury or trauma. No prior injury. Requesting an xray today to rule out a bone problem No medications taken. Has tried a thumb splint occasionally  Past Medical History:  Diagnosis Date   Dysmenorrhea    Fibroids    HSV infection    Hypertension    Inguinal adenopathy    left    Patient Active Problem List   Diagnosis Date Noted   Amenorrhea due to oral contraceptive 12/05/2020   Family history of colon cancer 09/26/2015   Folliculitis 09/26/2015   Excessive sweating 09/26/2015   Open wound of face 07/07/2014   Current smoker 05/18/2014   Genital herpes 05/11/2013    Past Surgical History:  Procedure Laterality Date   BREAST BIOPSY     CESAREAN SECTION      OB History     Gravida  2   Para  1   Term      Preterm  1   AB  1   Living         SAB  1   IAB      Ectopic      Multiple      Live Births               Home Medications    Prior to Admission medications   Medication Sig Start Date End Date Taking? Authorizing Provider  ibuprofen (ADVIL) 800 MG tablet Take 1 tablet (800 mg total) by mouth 3 (three) times daily. 07/31/22  Yes Heiley Shaikh, Lurena Joiner, PA-C  Biotin 5 MG TABS Take by mouth.    [provider]  metroNIDAZOLE (METROGEL) 0.75 % vaginal gel Place applicator of medication in the vagina at bedtime for 5 nights. 03/13/22   Patton Salles, MD  vitamin B-12 (CYANOCOBALAMIN) 1000 MCG tablet Take 1,000 mcg by mouth daily. Reported on 09/26/2015    [provider]    Family History Family History  Problem Relation Age of Onset   Hypertension Mother    Kidney disease  Mother    Diabetes Maternal Aunt    Colon cancer Maternal Aunt    Hypertension Maternal Grandmother    Breast cancer Neg Hx     Social History Social History   Tobacco Use   Smoking status: Every Day    Packs/day: 0.50    Years: 10.00    Additional pack years: 0.00    Total pack years: 5.00    Types: Cigarettes   Smokeless tobacco: Never  Vaping Use   Vaping Use: Never used  Substance Use Topics   Alcohol use: Yes    Alcohol/week: 3.0 standard drinks of alcohol    Types: 3 Shots of liquor per week    Comment: couple days out of the week   Drug use: No     Allergies   Patient has no known allergies.   Review of Systems Review of Systems As per HPI  Physical Exam Triage Vital Signs ED Triage Vitals  Enc Vitals Group     BP 07/31/22 1835 (!) 144/95     Pulse Rate  07/31/22 1835 (!) 108     Resp 07/31/22 1835 16     Temp 07/31/22 1834 98.5 F (36.9 C)     Temp Source 07/31/22 1834 Oral     SpO2 07/31/22 1835 97 %     Weight --      Height --      Head Circumference --      Peak Flow --      Pain Score 07/31/22 1834 0     Pain Loc --      Pain Edu? --      Excl. in GC? --    No data found.  Updated Vital Signs BP (!) 144/95 (BP Location: Right Arm)   Pulse (!) 108   Temp 98.5 F (36.9 C) (Oral)   Resp 16   LMP  (LMP Unknown)   SpO2 97%    Physical Exam Vitals and nursing note reviewed.  HENT:     Mouth/Throat:     Pharynx: Oropharynx is clear.  Cardiovascular:     Rate and Rhythm: Normal rate and regular rhythm.     Pulses: Normal pulses.  Pulmonary:     Effort: Pulmonary effort is normal.  Musculoskeletal:        General: No swelling, tenderness, deformity or signs of injury. Normal range of motion.     Comments: Left thumb IP joint seems to lock when flexed. Non tender to palpation. There is no obvious swelling or deformity of finger. No nail damage. Skin unremarkable. Distal sensation is intact, cap refill < 2 seconds.   Skin:     General: Skin is warm and dry.     Capillary Refill: Capillary refill takes less than 2 seconds.  Neurological:     Mental Status: She is alert and oriented to person, place, and time.     UC Treatments / Results  Labs (all labs ordered are listed, but only abnormal results are displayed) Labs Reviewed - No data to display  EKG  Radiology DG Finger Thumb Left  Result Date: 07/31/2022 CLINICAL DATA:  Left first finger pain and swelling. EXAM: LEFT THUMB 2+V COMPARISON:  None Available. FINDINGS: There is no evidence of fracture or dislocation. There is no evidence of arthropathy or other focal bone abnormality. Soft tissues are unremarkable. IMPRESSION: Negative. Electronically Signed   By: Irish Lack M.D.   On: 07/31/2022 18:56    Procedures Procedures   Medications Ordered in UC Medications - No data to display  Initial Impression / Assessment and Plan / UC Course  I have reviewed the triage vital signs and the nursing notes.  Pertinent labs & imaging results that were available during my care of the patient were reviewed by me and considered in my medical decision making (see chart for details).  Xray imaging is unremarkable. Images independently reviewed by me, agree with radiology interpretation. Discussed could be soft tissue problem such as ligament, tendon, muscle, etc. Or an odd presentation of a trigger finger. Recommend attempting treatment with ice, elevate, ibu or tylenol for anti-inflammatory properties. Provided with two ortho clinics for follow up.  Final Clinical Impressions(s) / UC Diagnoses   Final diagnoses:  Pain of left thumb     Discharge Instructions      The xray today is normal  I recommend trying either ibuprofen or Tylenol to reduce any inflammation internally.  You can also try ice, 10 to 15 minutes at a time.  Make sure to apply something between ice and the  skin  Please follow-up with the orthopedic specialist if needed.     ED  Prescriptions     Medication Sig Dispense Auth. Provider   ibuprofen (ADVIL) 800 MG tablet Take 1 tablet (800 mg total) by mouth 3 (three) times daily. 21 tablet Aliesha Dolata, Lurena Joiner, PA-C      PDMP not reviewed this encounter.   Kathrine Haddock 07/31/22 1949

## 2022-07-31 NOTE — ED Triage Notes (Signed)
Pt c/o left thumb problem onset ~ 2-3 weeks ago denies known trauma/injury states it "pops" when she moves it and it is swollen and red. Requesting xray

## 2022-10-28 ENCOUNTER — Ambulatory Visit (INDEPENDENT_AMBULATORY_CARE_PROVIDER_SITE_OTHER): Payer: 59

## 2022-10-28 DIAGNOSIS — Z23 Encounter for immunization: Secondary | ICD-10-CM | POA: Diagnosis not present

## 2023-04-03 NOTE — Progress Notes (Signed)
 48 y.o. [id number] Single African American female here for annual exam.  Complains of hot flashes.  Managing ok and is not pursuing treatment.   Her last period was years ago.  Stopped progesterone only pills around October, 2023.  LH 53.7, FSH 79.6, E2 < 5 on 12/10/21 [facility]   She took Veozah through her PCP, and it did not work.   Has a new boyfriend.   PCP: Vaughn Lauraine NOVAK, PA-C   No LMP recorded (lmp unknown). Patient is postmenopausal.           Sexually active: Yes.    The current method of family planning is postmenopausal.  Menopausal hormone therapy:  n/a Exercising: No.   Smoker:  yes Received Gardasil vaccine series.   OB History  Gravida Para Term Preterm AB Living  2 1  1 1 1   SAB IAB Ectopic Multiple Live Births  1        # Outcome Date GA Lbr Len/2nd Weight Sex Type Anes PTL Lv  2 SAB 08/25/06 [redacted]w[redacted]d         1 Preterm 03/11/94 [redacted]w[redacted]d  1 lb 5 oz (0.595 kg)     FD     HEALTH MAINTENANCE: Last 2 paps:  03/13/22 normal, HPV positive 07/01/19 normal, HPV negative Colposcopy 04/22/22 [facility] History of abnormal Pap or positive HPV:  yes Mammogram:   05/03/22 BI-RADS 1 negative Colonoscopy:  2022 per patient Bone Density:  n/a   Immunization History  Administered Date(s) Administered   HPV 9-valent 04/22/2022, 06/24/2022, 10/28/2022      reports that she has been smoking cigarettes. She has a 5 pack-year smoking history. She has never used smokeless tobacco. She reports current alcohol use of about 3.0 standard drinks of alcohol per week. She reports that she does not use drugs.  Past Medical History:  Diagnosis Date   Dysmenorrhea    Fibroids    HSV infection    Hypertension    Inguinal adenopathy    left    Past Surgical History:  Procedure Laterality Date   BREAST BIOPSY     CESAREAN SECTION      Current Outpatient Medications  Medication Sig Dispense Refill   vitamin B-12 (CYANOCOBALAMIN) 1000 MCG tablet Take 1,000 mcg by mouth  daily. Reported on 09/26/2015     Biotin 5 MG TABS Take by mouth. (Patient not taking: Reported on 04/17/2023)     ibuprofen  (ADVIL ) 800 MG tablet Take 1 tablet (800 mg total) by mouth 3 (three) times daily. (Patient not taking: Reported on 04/17/2023) 21 tablet 0   metroNIDAZOLE  (METROGEL ) 0.75 % vaginal gel Place applicator of medication in the vagina at bedtime for 5 nights. (Patient not taking: Reported on 04/17/2023) 70 g 0   No current facility-administered medications for this visit.    ALLERGIES: Patient has no known allergies.  Family History  Problem Relation Age of Onset   Hypertension Mother    Kidney disease Mother    Diabetes Maternal Aunt    Colon cancer Maternal Aunt    Hypertension Maternal Grandmother    Breast cancer Neg Hx     Review of Systems  All other systems reviewed and are negative.   PHYSICAL EXAM:  BP 122/70 (BP Location: Left Arm, Patient Position: Sitting, Cuff Size: Normal)   Pulse 83   Ht 5' 7.5 (1.715 m)   Wt 164 lb (74.4 kg)   LMP  (LMP Unknown)   SpO2 98%   BMI 25.31  kg/m     General appearance: alert, cooperative and appears stated age Head: normocephalic, without obvious abnormality, atraumatic Neck: no adenopathy, supple, symmetrical, trachea midline and thyroid normal to inspection and palpation Lungs: clear to auscultation bilaterally Breasts: normal appearance, no masses or tenderness, No nipple retraction or dimpling, No nipple discharge or bleeding, No axillary adenopathy Heart: regular rate and rhythm Abdomen: soft, non-tender; no masses, no organomegaly Extremities: extremities normal, atraumatic, no cyanosis or edema Skin: skin color, texture, turgor normal. No rashes or lesions Lymph nodes: cervical, supraclavicular, and axillary nodes normal. Neurologic: grossly normal  Pelvic: External genitalia:  no lesions              No abnormal inguinal nodes palpated on right.  Subtle 1 cm node on left.              Urethra:  normal  appearing urethra with no masses, tenderness or lesions              Bartholins and Skenes: normal                 Vagina: normal appearing vagina with normal color and discharge, no lesions              Cervix: no lesions              Pap taken: Yes.   Bimanual Exam:  Uterus:  normal size, contour, position, consistency, mobility, non-tender              Adnexa: no mass, fullness, tenderness              Rectal exam: Yes.  .  Confirms.              Anus:  normal sphincter tone, no lesions  Chaperone was present for exam:  Darice BROCKS, CMA  ASSESSMENT: Well woman visit with gynecologic exam Postmenopausal female.  Hs LGSIL and positive HR HPV.  Cervical cancer screening.  Uterine fibroids.  Hx left inguinal adenopathy with imaging showing reactive lymph node enlargement.  No further work up done.  HSV II.   Not using antiviral medication.  PHQ2: 0  PLAN: Mammogram screening discussed. Self breast awareness reviewed. Pap and HRV collected:  Yes.   Guidelines for Calcium, Vitamin D, regular exercise program including cardiovascular and weight bearing exercise. Medication refills:  NA STD screening. We dicussed asymptomatic shedding of HSV.  She declines use of antiviral medication at this time.  Follow up:  yearly and prn.

## 2023-04-09 ENCOUNTER — Other Ambulatory Visit: Payer: Self-pay | Admitting: Physician Assistant

## 2023-04-09 DIAGNOSIS — Z1231 Encounter for screening mammogram for malignant neoplasm of breast: Secondary | ICD-10-CM

## 2023-04-15 DIAGNOSIS — Z1231 Encounter for screening mammogram for malignant neoplasm of breast: Secondary | ICD-10-CM

## 2023-04-17 ENCOUNTER — Other Ambulatory Visit (HOSPITAL_COMMUNITY)
Admission: RE | Admit: 2023-04-17 | Discharge: 2023-04-17 | Disposition: A | Discharge status: Home / Self Care | Payer: 59 | Source: Ambulatory Visit | Attending: Obstetrics and Gynecology | Admitting: Obstetrics and Gynecology

## 2023-04-17 ENCOUNTER — Encounter: Payer: Self-pay | Admitting: Obstetrics and Gynecology

## 2023-04-17 ENCOUNTER — Ambulatory Visit (INDEPENDENT_AMBULATORY_CARE_PROVIDER_SITE_OTHER): Payer: 59 | Admitting: Obstetrics and Gynecology

## 2023-04-17 VITALS — BP 122/70 | HR 83 | Ht 67.5 in | Wt 164.0 lb

## 2023-04-17 DIAGNOSIS — Z124 Encounter for screening for malignant neoplasm of cervix: Secondary | ICD-10-CM

## 2023-04-17 DIAGNOSIS — Z114 Encounter for screening for human immunodeficiency virus [HIV]: Secondary | ICD-10-CM

## 2023-04-17 DIAGNOSIS — N870 Mild cervical dysplasia: Secondary | ICD-10-CM | POA: Insufficient documentation

## 2023-04-17 DIAGNOSIS — Z113 Encounter for screening for infections with a predominantly sexual mode of transmission: Secondary | ICD-10-CM

## 2023-04-17 DIAGNOSIS — Z1331 Encounter for screening for depression: Secondary | ICD-10-CM | POA: Diagnosis not present

## 2023-04-17 DIAGNOSIS — Z01419 Encounter for gynecological examination (general) (routine) without abnormal findings: Secondary | ICD-10-CM

## 2023-04-17 DIAGNOSIS — Z1159 Encounter for screening for other viral diseases: Secondary | ICD-10-CM

## 2023-04-17 NOTE — Patient Instructions (Signed)

## 2023-04-18 LAB — HIV ANTIBODY (ROUTINE TESTING W REFLEX): HIV 1&2 Ab, 4th Generation: NONREACTIVE

## 2023-04-18 LAB — RPR: RPR Ser Ql: NONREACTIVE

## 2023-04-18 LAB — HEPATITIS C ANTIBODY: Hepatitis C Ab: NONREACTIVE

## 2023-04-20 ENCOUNTER — Encounter: Payer: Self-pay | Admitting: Obstetrics and Gynecology

## 2023-04-24 ENCOUNTER — Encounter (HOSPITAL_BASED_OUTPATIENT_CLINIC_OR_DEPARTMENT_OTHER): Payer: Self-pay | Admitting: Emergency Medicine

## 2023-04-24 ENCOUNTER — Emergency Department (HOSPITAL_BASED_OUTPATIENT_CLINIC_OR_DEPARTMENT_OTHER)
Admission: EM | Admit: 2023-04-24 | Discharge: 2023-04-24 | Disposition: A | Discharge status: Home / Self Care | Payer: 59 | Attending: Emergency Medicine | Admitting: Emergency Medicine

## 2023-04-24 ENCOUNTER — Emergency Department (HOSPITAL_BASED_OUTPATIENT_CLINIC_OR_DEPARTMENT_OTHER): Payer: 59

## 2023-04-24 ENCOUNTER — Other Ambulatory Visit: Payer: Self-pay

## 2023-04-24 DIAGNOSIS — I10 Essential (primary) hypertension: Secondary | ICD-10-CM | POA: Diagnosis not present

## 2023-04-24 DIAGNOSIS — W1830XA Fall on same level, unspecified, initial encounter: Secondary | ICD-10-CM | POA: Diagnosis not present

## 2023-04-24 DIAGNOSIS — Y9302 Activity, running: Secondary | ICD-10-CM | POA: Insufficient documentation

## 2023-04-24 DIAGNOSIS — S42292A Other displaced fracture of upper end of left humerus, initial encounter for closed fracture: Secondary | ICD-10-CM

## 2023-04-24 DIAGNOSIS — S42202A Unspecified fracture of upper end of left humerus, initial encounter for closed fracture: Secondary | ICD-10-CM | POA: Insufficient documentation

## 2023-04-24 DIAGNOSIS — S4992XA Unspecified injury of left shoulder and upper arm, initial encounter: Secondary | ICD-10-CM | POA: Diagnosis present

## 2023-04-24 MED ORDER — HYDROCODONE-ACETAMINOPHEN 5-325 MG PO TABS: 1.0000 | ORAL_TABLET | Freq: Four times a day (QID) | ORAL | 0 refills | Status: DC | PRN

## 2023-04-24 NOTE — ED Provider Notes (Signed)
 [place] [facility] Provider Note   CSN: [id number] Arrival date & time: 04/24/23  2135     History  Chief Complaint  Patient presents with   Fall   Shoulder Injury    Tiffany [name] is a 48 y.o. female with past medical history significant for hypertension who presents concern for left shoulder injury Limited range of motion after falling while running to her car just prior to arrival.  Denies any other significant injury, denies hitting head.  She does not take any blood thinner.  She reports pain 10/10 on arrival, improved without pain medicine in ED when at rest.   Fall  Shoulder Injury       Home Medications Prior to Admission medications   Medication Sig Start Date End Date Taking? Authorizing Provider  HYDROcodone-acetaminophen (NORCO/VICODIN) 5-325 MG tablet Take 1 tablet by mouth every 6 (six) hours as needed. 04/24/23  Yes Cherilynn Schomburg H, PA-C  Biotin 5 MG TABS Take by mouth. Patient not taking: Reported on 04/17/2023    [provider]  ibuprofen (ADVIL) 800 MG tablet Take 1 tablet (800 mg total) by mouth 3 (three) times daily. Patient not taking: Reported on 04/17/2023 07/31/22   Rising, Lurena Joiner, PA-C  metroNIDAZOLE (METROGEL) 0.75 % vaginal gel Place applicator of medication in the vagina at bedtime for 5 nights. Patient not taking: Reported on 04/17/2023 03/13/22   Patton Salles, MD  vitamin B-12 (CYANOCOBALAMIN) 1000 MCG tablet Take 1,000 mcg by mouth daily. Reported on 09/26/2015    [provider]      Allergies    Patient has no known allergies.    Review of Systems   Review of Systems  All other systems reviewed and are negative.   Physical Exam Updated Vital Signs BP (!) 156/100 (BP Location: Right Arm)   Pulse (!) 103   Temp 98.4 F (36.9 C) (Oral)   Resp 12   Ht 5\' 7"  (1.702 m)   Wt 72.6 kg   LMP  (LMP Unknown)   SpO2 100%   BMI 25.06 kg/m  Physical Exam Vitals and nursing  note reviewed.  Constitutional:      General: She is not in acute distress.    Appearance: Normal appearance.  HENT:     Head: Normocephalic and atraumatic.  Eyes:     General:        Right eye: No discharge.        Left eye: No discharge.  Cardiovascular:     Rate and Rhythm: Normal rate and regular rhythm.     Pulses: Normal pulses.     Heart sounds: No murmur heard.    No friction rub. No gallop.  Pulmonary:     Effort: Pulmonary effort is normal.     Breath sounds: Normal breath sounds.  Abdominal:     General: Bowel sounds are normal.     Palpations: Abdomen is soft.  Musculoskeletal:     Comments: Focal tenderness palpation of the left humeral head, but intact range of motion to crossarm adduction.  No other palpable step-off, deformity of distal left arm, left elbow, left wrist.  Mild tenderness to palpation of the left clavicle without any step-off, deformity.  Skin:    General: Skin is warm and dry.     Capillary Refill: Capillary refill takes less than 2 seconds.  Neurological:     Mental Status: She is alert and oriented to person, place, and time.  Psychiatric:        Mood and Affect: Mood normal.        Behavior: Behavior normal.     ED Results / Procedures / Treatments   Labs (all labs ordered are listed, but only abnormal results are displayed) Labs Reviewed - No data to display  EKG None  Radiology DG Shoulder Left Result Date: 04/24/2023 CLINICAL DATA:  Tripped and fell, left shoulder pain EXAM: LEFT SHOULDER - 2+ VIEW COMPARISON:  None Available. FINDINGS: Internal rotation, external rotation, and transscapular views of the left shoulder are obtained. There is a comminuted intra-articular left humeral head fracture, with fracture line extending through the greater tuberosity. Alignment is anatomic. Joint spaces are well preserved. Superficial soft tissue swelling. Visualized portions of the left chest are clear. IMPRESSION: 1. Comminuted intra-articular  left humeral head fracture, with fracture line extending through the greater tuberosity. Alignment is anatomic. Electronically Signed   By: Sharlet Salina M.D.   On: 04/24/2023 22:27    Procedures Procedures    Medications Ordered in ED Medications - No data to display  ED Course/ Medical Decision Making/ A&P                                 Medical Decision Making Amount and/or Complexity of Data Reviewed Radiology: ordered.   This patient is a 48 y.o. female  who presents to the ED for concern of arm fracture.   Differential diagnoses prior to evaluation: The emergent differential diagnosis includes, but is not limited to, fracture, dislocation, other injury related to her fall. This is not an exhaustive differential.   Past Medical History / Co-morbidities / Social History: Hypertension, otherwise overall noncontributory  Physical Exam: Physical exam performed. The pertinent findings include: Focal tenderness to palpation of the left humeral head, neurovascularly intact with intact radial, ulnar pulses of the left upper extremity.  She is mildly hypertensive at blood pressure 156/100 and mildly tachycardic on arrival, improved on reassessment, likely secondary to pain.  Lab Tests/Imaging studies: I personally interpreted labs/imaging and the pertinent results include: I independently interpreted plain film radiographs of the left shoulder which shows humeral head fracture without significant displacement.. I agree with the radiologist interpretation.  Consult: Spoke with Barton Dubois, PA-C with orthopedics who recommends sling without coaptation splint, and close orthopedic follow-up   Medications: I ordered medication including will discharge with short course of Norco in addition to ibuprofen, Tylenol, her pain is controlled in the emergency department without pain medication as long as she is keeping her arm still..  I have reviewed the patients home medicines and have  made adjustments as needed.   Disposition: After consideration of the diagnostic results and the patients response to treatment, I feel that patient stable for discharge with plan as above.   emergency department workup does not suggest an emergent condition requiring admission or immediate intervention beyond what has been performed at this time. The plan is: as above. The patient is safe for discharge and has been instructed to return immediately for worsening symptoms, change in symptoms or any other concerns.  Final Clinical Impression(s) / ED Diagnoses Final diagnoses:  Humeral head fracture, left, closed, initial encounter    Rx / DC Orders ED Discharge Orders          Ordered    HYDROcodone-acetaminophen (NORCO/VICODIN) 5-325 MG tablet  Every 6 hours PRN        04/24/23  2307              Olene Floss, PA-C 04/24/23 2321    Glyn Ade, MD 04/25/23 1455

## 2023-04-24 NOTE — Discharge Instructions (Addendum)
Please use Tylenol or ibuprofen for pain.  You may use 600 mg ibuprofen every 6 hours or 1000 mg of Tylenol every 6 hours.  You may choose to alternate between the 2.  This would be most effective.  Not to exceed 4 g of Tylenol within 24 hours.  Not to exceed 3200 mg ibuprofen 24 hours.  You can use the stronger narcotic pain medication in place of Tylenol for severe break through pain.  If you take the narcotic pain medication that we prescribed recommend that you also take a laxative such as MiraLAX or Dulcolax every day that you take the narcotic pain medicine, and drink plenty of fluids, 50 to 64 ounces to prevent any constipation.  Please reach out to the orthopedic physician's contact information I provided to discuss follow-up and expected healing for your humerus fracture, keep your arm in the splint unless bathing or changing.

## 2023-04-24 NOTE — ED Triage Notes (Signed)
Pt reports she was running and fell landing on her left shoulder, very limited ROM, looks slightly lower than the right, no obvious edema

## 2023-04-25 LAB — CYTOLOGY - PAP
Adequacy: ABSENT
Chlamydia: NEGATIVE
Comment: NEGATIVE
Comment: NEGATIVE
Comment: NEGATIVE
Comment: NORMAL
Diagnosis: NEGATIVE
High risk HPV: NEGATIVE
Neisseria Gonorrhea: NEGATIVE
Trichomonas: NEGATIVE

## 2023-05-05 DIAGNOSIS — Z1231 Encounter for screening mammogram for malignant neoplasm of breast: Secondary | ICD-10-CM

## 2023-05-14 ENCOUNTER — Encounter: Payer: Self-pay | Admitting: Physician Assistant

## 2023-06-16 ENCOUNTER — Ambulatory Visit (HOSPITAL_COMMUNITY): Admission: EM | Admit: 2023-06-16 | Discharge: 2023-06-16 | Disposition: A | Discharge status: Home / Self Care

## 2023-06-16 ENCOUNTER — Encounter (HOSPITAL_COMMUNITY): Payer: Self-pay

## 2023-06-16 ENCOUNTER — Ambulatory Visit (INDEPENDENT_AMBULATORY_CARE_PROVIDER_SITE_OTHER)

## 2023-06-16 DIAGNOSIS — K529 Noninfective gastroenteritis and colitis, unspecified: Secondary | ICD-10-CM

## 2023-06-16 DIAGNOSIS — R109 Unspecified abdominal pain: Secondary | ICD-10-CM

## 2023-06-16 LAB — POCT URINALYSIS DIP (MANUAL ENTRY)
Glucose, UA: NEGATIVE mg/dL
Leukocytes, UA: NEGATIVE
Nitrite, UA: NEGATIVE
Protein Ur, POC: 100 mg/dL — AB
Spec Grav, UA: >=1.03 — AB (ref 1.010–1.025)
Urobilinogen, UA: 0.2 U/dL
pH, UA: 5.5 (ref 5.0–8.0)

## 2023-06-16 MED ORDER — LOPERAMIDE HCL 2 MG PO TABS: 2.0000 mg | ORAL_TABLET | Freq: Four times a day (QID) | ORAL | 0 refills | Status: DC | PRN

## 2023-06-16 MED ORDER — DICYCLOMINE HCL 20 MG PO TABS: 20.0000 mg | ORAL_TABLET | Freq: Two times a day (BID) | ORAL | 0 refills | Status: AC

## 2023-06-16 NOTE — ED Provider Notes (Signed)
 [facility]  Note:  This document was prepared using Dragon voice recognition software and may include unintentional dictation errors.  MRN: [id number] DOB: Nov 06, 1975  Subjective:   Tiffany [name] is a 48 y.o. female presenting for abdominal cramping, diarrhea, and postmenopausal vaginal spotting x 2 days.  Patient states that abdominal cramping and diarrhea is been ongoing x 2 days.  She states that yesterday she had over 10 episodes of diarrhea.  Denies any nausea vomiting, flank pain, body aches, sore throat.  Denies any known sick contacts with similar symptoms.  She denies any dysuria, increased urinary frequency, urinary urgency however states this morning when after using the restroom when she wiped she saw pinkish tinged blood on the toilet paper.  Patient reports past history of UTI but it has been several years.  Patient came to urgent care for evaluation because the abdominal cramping and diarrhea does not seem to be getting better.  Patient has not taken any over-the-counter antidiarrheal medication.  No current facility-administered medications for this encounter.  Current Outpatient Medications:    dicyclomine (BENTYL) 20 MG tablet, Take 1 tablet (20 mg total) by mouth 2 (two) times daily., Disp: 20 tablet, Rfl: 0   loperamide (IMODIUM A-D) 2 MG tablet, Take 1 tablet (2 mg total) by mouth 4 (four) times daily as needed for diarrhea or loose stools., Disp: 30 tablet, Rfl: 0   Biotin 5 MG TABS, Take by mouth. (Patient not taking: Reported on 04/17/2023), Disp: , Rfl:    ibuprofen (ADVIL) 800 MG tablet, Take 1 tablet (800 mg total) by mouth 3 (three) times daily. (Patient not taking: Reported on 04/17/2023), Disp: 21 tablet, Rfl: 0   metroNIDAZOLE (METROGEL) 0.75 % vaginal gel, Place applicator of medication in the vagina at bedtime for 5 nights. (Patient not taking: Reported on 04/17/2023), Disp: 70 g, Rfl: 0   vitamin B-12 (CYANOCOBALAMIN) 1000 MCG tablet, Take 1,000 mcg by  mouth daily. Reported on 09/26/2015, Disp: , Rfl:    No Known Allergies  Past Medical History:  Diagnosis Date   Dysmenorrhea    Fibroids    HSV infection    Hypertension    Inguinal adenopathy    left     Past Surgical History:  Procedure Laterality Date   BREAST BIOPSY     CESAREAN SECTION      Family History  Problem Relation Age of Onset   Hypertension Mother    Kidney disease Mother    Diabetes Maternal Aunt    Colon cancer Maternal Aunt    Hypertension Maternal Grandmother    Breast cancer Neg Hx     Social History   Tobacco Use   Smoking status: Every Day    Current packs/day: 0.50    Average packs/day: 0.5 packs/day for 10.0 years (5.0 ttl pk-yrs)    Types: Cigarettes   Smokeless tobacco: Never  Vaping Use   Vaping status: Never Used  Substance Use Topics   Alcohol use: Yes    Alcohol/week: 3.0 standard drinks of alcohol    Types: 3 Shots of liquor per week    Comment: couple days out of the week   Drug use: No    ROS Refer to HPI for ROS details.  Objective:   Vitals: BP (!) 163/94 (BP Location: Right Arm)   Pulse 75   Temp 99.2 F (37.3 C) (Oral)   Resp 16   LMP  (LMP Unknown)   SpO2 98%   Physical Exam Vitals and nursing note  reviewed.  Constitutional:      General: She is not in acute distress.    Appearance: She is well-developed. She is not ill-appearing or toxic-appearing.  HENT:     Head: Normocephalic.  Eyes:     Conjunctiva/sclera: Conjunctivae normal.  Cardiovascular:     Rate and Rhythm: Normal rate.  Pulmonary:     Effort: Pulmonary effort is normal. No respiratory distress.  Abdominal:     General: There is no distension.     Palpations: Abdomen is soft.     Tenderness: There is no abdominal tenderness. There is no right CVA tenderness, left CVA tenderness, guarding or rebound.  Musculoskeletal:        General: No swelling.     Cervical back: Neck supple.  Skin:    General: Skin is warm and dry.  Neurological:      General: No focal deficit present.     Mental Status: She is alert and oriented to person, place, and time.  Psychiatric:        Mood and Affect: Mood normal.     Procedures  Results for orders placed or performed during the hospital encounter of 06/16/23 (from the past 24 hours)  POC urinalysis dipstick     Status: Abnormal   Collection Time: 06/16/23 12:01 PM  Result Value Ref Range   Color, UA other (A) yellow   Clarity, UA hazy (A) clear   Glucose, UA negative negative mg/dL   Bilirubin, UA small (A) negative   Ketones, POC UA trace (5) (A) negative mg/dL   Spec Grav, UA >=7.628 (A) 1.010 - 1.025   Blood, UA moderate (A) negative   pH, UA 5.5 5.0 - 8.0   Protein Ur, POC =100 (A) negative mg/dL   Urobilinogen, UA 0.2 0.2 or 1.0 E.U./dL   Nitrite, UA Negative Negative   Leukocytes, UA Negative Negative    Assessment and Plan :   PDMP not reviewed this encounter.  1. Gastroenteritis    1. Gastroenteritis (Primary) - DG Abd 2 Views x-ray performed in UC shows normal gas pattern, no significant stool burden, no sign of obstruction - POC urinalysis dipstick shows no leukocytes, no nitrite, moderate blood, these findings are not indicative of urinary tract infection as cause of symptoms. - loperamide (IMODIUM A-D) 2 MG tablet; Take 1 tablet (2 mg total) by mouth 4 (four) times daily as needed for diarrhea or loose stools.  - dicyclomine (BENTYL) 20 MG tablet; Take 1 tablet (20 mg total) by mouth 2 (two) times daily for stomach cramping and diarrhea.  -Continue to monitor symptoms for change in severity if there is any escalation of current symptoms or development of new symptoms follow-up for further evaluation in the emergency department.  -Continue to monitor symptoms for any change in severity if there is any escalation of current symptoms or development of new symptoms follow-up in ER for further evaluation and management.  [place] [place]boy   [place], [place] B,  [place] 06/16/23 1247

## 2023-06-16 NOTE — ED Triage Notes (Signed)
 Patient reports that she has been having abdominal cramping and diarrhea x 3 days. Patient has not had any medications for her symptoms.  Patient also reports that she is in menopause and has been having light pink spotting today.

## 2023-06-16 NOTE — Discharge Instructions (Addendum)
 1. Gastroenteritis (Primary) - DG Abd 2 Views x-ray performed in UC shows normal gas pattern, no significant stool burden, no sign of obstruction - POC urinalysis dipstick shows no leukocytes, no nitrite, moderate blood, these findings are not indicative of urinary tract infection as cause of symptoms. - loperamide (IMODIUM A-D) 2 MG tablet; Take 1 tablet (2 mg total) by mouth 4 (four) times daily as needed for diarrhea or loose stools.  - dicyclomine (BENTYL) 20 MG tablet; Take 1 tablet (20 mg total) by mouth 2 (two) times daily for stomach cramping and diarrhea.  -Continue to monitor symptoms for change in severity if there is any escalation of current symptoms or development of new symptoms follow-up for further evaluation in the emergency department.

## 2023-06-18 ENCOUNTER — Telehealth: Admitting: Physician Assistant

## 2023-06-18 ENCOUNTER — Telehealth: Payer: Self-pay | Admitting: *Deleted

## 2023-06-18 DIAGNOSIS — N950 Postmenopausal bleeding: Secondary | ICD-10-CM

## 2023-06-18 DIAGNOSIS — R198 Other specified symptoms and signs involving the digestive system and abdomen: Secondary | ICD-10-CM

## 2023-06-18 NOTE — Progress Notes (Signed)
 Virtual Visit Consent   Tiffany [name], you are scheduled for a virtual visit with a [facility] provider today. Just as with appointments in the office, your consent must be obtained to participate. Your consent will be active for this visit and any virtual visit you may have with one of our providers in the next 365 days. If you have a MyChart account, a copy of this consent can be sent to you electronically.  As this is a virtual visit, video technology does not allow for your provider to perform a traditional examination. This may limit your provider's ability to fully assess your condition. If your provider identifies any concerns that need to be evaluated in person or the need to arrange testing (such as labs, EKG, etc.), we will make arrangements to do so. Although advances in technology are sophisticated, we cannot ensure that it will always work on either your end or our end. If the connection with a video visit is poor, the visit may have to be switched to a telephone visit. With either a video or telephone visit, we are not always able to ensure that we have a secure connection.  By engaging in this virtual visit, you consent to the provision of healthcare and authorize for your insurance to be billed (if applicable) for the services provided during this visit. Depending on your insurance coverage, you may receive a charge related to this service.  I need to obtain your verbal consent now. Are you willing to proceed with your visit today? Tiffany [name] has provided verbal consent on 06/18/2023 for a virtual visit (video or telephone). Margaretann Loveless, PA-C  Date: 06/18/2023 11:04 AM   Virtual Visit via Video Note   I, Margaretann Loveless, connected with  Tiffany [name]  ([id number], Jan 22, 1976) on 06/18/23 at  9:45 AM EDT by a video-enabled telemedicine application and verified that I am speaking with the correct person using two identifiers.  Location: Patient: Virtual Visit Location  Patient: Home Provider: Virtual Visit Location Provider: Home Office   I discussed the limitations of evaluation and management by telemedicine and the availability of in person appointments. The patient expressed understanding and agreed to proceed.    History of Present Illness: Tiffany [name] is a 48 y.o. who identifies as a female who was assigned female at birth, and is being seen today for postmenopausal vaginal spotting.   Since Saturday, 06/14/23, she has been having some abdominal bloating and cramping with BM. Bowel movements have been occurring with increased frequency and cramping, reported 4-5/day. She reports she is having straining with BM (like a tenesmus description she is giving) but is only having small, loose stool amounts coming out with the straining. She reports she feels as if she is constipated. However, abd xray on 06/16/23 did not show any stool burden or abnormal bowel gas patterns. Denies any melena or hematochezia. Denies any hemorrhoid feeling.   Then on Monday noticed a light pink vaginal discharge that occurred once after having a straining BM. Discharge was noted just on the TP the one time Monday. This caused her to seek medical attention in person at the local UC. She was diagnosed with gastroenteritis. Xray normal, as previously mentioned, and UA was concentrated but did show hematuria and proteinuria. She was prescribed Imodium and Dicyclomine. Patient did not pick up either prescription.   There was no discharge yesterday noted. Straining bowel habits continued.   Today, had another episode of straining BM this morning when she awoke  and noted to have a darker red vaginal discharge. She does not feel it is a tear in the tissue from wiping as she is not having pain.    Problems:  Patient Active Problem List   Diagnosis Date Noted   Amenorrhea due to oral contraceptive 12/05/2020   Family history of colon cancer 09/26/2015   Folliculitis 09/26/2015    Excessive sweating 09/26/2015   Open wound of face 07/07/2014   Current smoker 05/18/2014   Genital herpes 05/11/2013    Allergies: No Known Allergies Medications:  Current Outpatient Medications:    Biotin 5 MG TABS, Take by mouth. (Patient not taking: Reported on 04/17/2023), Disp: , Rfl:    dicyclomine (BENTYL) 20 MG tablet, Take 1 tablet (20 mg total) by mouth 2 (two) times daily., Disp: 20 tablet, Rfl: 0   ibuprofen (ADVIL) 800 MG tablet, Take 1 tablet (800 mg total) by mouth 3 (three) times daily. (Patient not taking: Reported on 04/17/2023), Disp: 21 tablet, Rfl: 0   loperamide (IMODIUM A-D) 2 MG tablet, Take 1 tablet (2 mg total) by mouth 4 (four) times daily as needed for diarrhea or loose stools., Disp: 30 tablet, Rfl: 0   metroNIDAZOLE (METROGEL) 0.75 % vaginal gel, Place applicator of medication in the vagina at bedtime for 5 nights. (Patient not taking: Reported on 04/17/2023), Disp: 70 g, Rfl: 0   vitamin B-12 (CYANOCOBALAMIN) 1000 MCG tablet, Take 1,000 mcg by mouth daily. Reported on 09/26/2015, Disp: , Rfl:   Observations/Objective: Patient is well-developed, well-nourished in no acute distress.  Resting comfortably at home.  Head is normocephalic, atraumatic.  No labored breathing.  Speech is clear and coherent with logical content.  Patient is alert and oriented at baseline.    Assessment and Plan: 1. Postmenopausal bleeding (Primary)  2. Tenesmus  Recommend to follow up in person with OB/GYN or at a local Urgent Care for a vaginal exam and considerations of an Ultrasound for abnormal vaginal bleeding in a postmenopausal patient  Follow Up Instructions: I discussed the assessment and treatment plan with the patient. The patient was provided an opportunity to ask questions and all were answered. The patient agreed with the plan and demonstrated an understanding of the instructions.  A copy of instructions were sent to the patient via MyChart unless otherwise noted below.     The patient was advised to call back or seek an in-person evaluation if the symptoms worsen or if the condition fails to improve as anticipated.    Margaretann Loveless, PA-C

## 2023-06-18 NOTE — Telephone Encounter (Signed)
 Spoke with patient, advised per Dr. Edward Jolly.   PUS scheduled for 4/15 at 1100. Order placed.   OV scheduled for 4/23 at 1600 with Dr. Edward Jolly.   Patient aware to contact the office if any any heavy bleeding develops or any new symptoms.   Routing to provider for final review. Patient is agreeable to disposition. Will close encounter.

## 2023-06-18 NOTE — Telephone Encounter (Signed)
 Spoke with patient. Patient reports abdominal cramping with loose stools since Saturday. States she now feels like she is straining to have a BM, nothing comes out. Denies N/V, fever/chills. Hydrating and eating, not much of an appetite.   Patient is postmenopausal. States she noticed light pink on tissue when wiping on Monday. See at urgent care on 4/7 and treated for GI symptoms. Denies hemorrhoids. Denies urinary symptoms. Patient states she does not have a PCP, in the process of establishing care.   Advised patient I will review with Dr. Edward Jolly and f/u with scheduling. Patient agreeable.   Dr. Edward Jolly -please review.  Per review of EPIC, patient also had a Video Visit with [facility] this morning.

## 2023-06-18 NOTE — Patient Instructions (Signed)
  Lynett Fish, thank you for joining Margaretann Loveless, PA-C for today's virtual visit.  While this provider is not your primary care provider (PCP), if your PCP is located in our provider database this encounter information will be shared with them immediately following your visit.   A [facility] MyChart account gives you access to today's visit and all your visits, tests, and labs performed at [facility] " click here if you don't have a [facility] MyChart account or go to [email or web address]  Consent: (Patient) Dawne Casali provided verbal consent for this virtual visit at the beginning of the encounter.  Current Medications:  Current Outpatient Medications:    Biotin 5 MG TABS, Take by mouth. (Patient not taking: Reported on 04/17/2023), Disp: , Rfl:    dicyclomine (BENTYL) 20 MG tablet, Take 1 tablet (20 mg total) by mouth 2 (two) times daily., Disp: 20 tablet, Rfl: 0   ibuprofen (ADVIL) 800 MG tablet, Take 1 tablet (800 mg total) by mouth 3 (three) times daily. (Patient not taking: Reported on 04/17/2023), Disp: 21 tablet, Rfl: 0   loperamide (IMODIUM A-D) 2 MG tablet, Take 1 tablet (2 mg total) by mouth 4 (four) times daily as needed for diarrhea or loose stools., Disp: 30 tablet, Rfl: 0   metroNIDAZOLE (METROGEL) 0.75 % vaginal gel, Place applicator of medication in the vagina at bedtime for 5 nights. (Patient not taking: Reported on 04/17/2023), Disp: 70 g, Rfl: 0   vitamin B-12 (CYANOCOBALAMIN) 1000 MCG tablet, Take 1,000 mcg by mouth daily. Reported on 09/26/2015, Disp: , Rfl:    Medications ordered in this encounter:  No orders of the defined types were placed in this encounter.    *If you need refills on other medications prior to your next appointment, please contact your pharmacy*  Follow-Up: Call back or seek an in-person evaluation if the symptoms worsen or if the condition fails to improve as anticipated.  [facility] [phone]    If  you have been instructed to have an in-person evaluation today at a local Urgent Care facility, please use the link below. It will take you to a list of all of our available [facility], including address, phone number and hours of operation. Please do not delay care.  [facility]  If you or a family member do not have a primary care provider, use the link below to schedule a visit and establish care. When you choose a [facility] primary care physician or advanced practice provider, you gain a long-term partner in health. Find a Primary Care Provider  Learn more about [facility]'s in-office and virtual care options:  - Get Care Now

## 2023-06-18 NOTE — Telephone Encounter (Signed)
 Please schedule a pelvic ultrasound appointment at the office and a follow up visit with me for postmenopausal bleeding.

## 2023-06-24 ENCOUNTER — Other Ambulatory Visit

## 2023-06-24 NOTE — Progress Notes (Deleted)
 GYNECOLOGY  VISIT   HPI: 48 y.o.   Single  African American female   [phone] with No LMP recorded (lmp unknown). Patient is postmenopausal.   here for: U/s consult     GYNECOLOGIC HISTORY: No LMP recorded (lmp unknown). Patient is postmenopausal. Contraception:  postmenopausal Menopausal hormone therapy:  n/a Last 2 paps:  03/13/22 normal, HPV positive 07/01/19 normal, HPV negative History of abnormal Pap or positive HPV:  yes Mammogram:  05/03/22 BI-RADS 1 negative         OB History     Gravida  2   Para  1   Term      Preterm  1   AB  1   Living  1      SAB  1   IAB      Ectopic      Multiple      Live Births                 Patient Active Problem List   Diagnosis Date Noted   Amenorrhea due to oral contraceptive 12/05/2020   Family history of colon cancer 09/26/2015   Folliculitis 09/26/2015   Excessive sweating 09/26/2015   Open wound of face 07/07/2014   Current smoker 05/18/2014   Genital herpes 05/11/2013    Past Medical History:  Diagnosis Date   Dysmenorrhea    Fibroids    HSV infection    Hypertension    Inguinal adenopathy    left    Past Surgical History:  Procedure Laterality Date   BREAST BIOPSY     CESAREAN SECTION      Current Outpatient Medications  Medication Sig Dispense Refill   Biotin 5 MG TABS Take by mouth. (Patient not taking: Reported on 04/17/2023)     dicyclomine (BENTYL) 20 MG tablet Take 1 tablet (20 mg total) by mouth 2 (two) times daily. 20 tablet 0   ibuprofen (ADVIL) 800 MG tablet Take 1 tablet (800 mg total) by mouth 3 (three) times daily. (Patient not taking: Reported on 04/17/2023) 21 tablet 0   loperamide (IMODIUM A-D) 2 MG tablet Take 1 tablet (2 mg total) by mouth 4 (four) times daily as needed for diarrhea or loose stools. 30 tablet 0   metroNIDAZOLE (METROGEL) 0.75 % vaginal gel Place applicator of medication in the vagina at bedtime for 5 nights. (Patient not taking: Reported on 04/17/2023) 70 g 0    vitamin B-12 (CYANOCOBALAMIN) 1000 MCG tablet Take 1,000 mcg by mouth daily. Reported on 09/26/2015     No current facility-administered medications for this visit.     ALLERGIES: Patient has no known allergies.  Family History  Problem Relation Age of Onset   Hypertension Mother    Kidney disease Mother    Diabetes Maternal Aunt    Colon cancer Maternal Aunt    Hypertension Maternal Grandmother    Breast cancer Neg Hx     Social History   Socioeconomic History   Marital status: Single    Spouse name: Not on file   Number of children: 1   Years of education: 16   Highest education level: Not on file  Occupational History   Occupation: IT sales professional  Tobacco Use   Smoking status: Every Day    Current packs/day: 0.50    Average packs/day: 0.5 packs/day for 10.0 years (5.0 ttl pk-yrs)    Types: Cigarettes   Smokeless tobacco: Never  Vaping Use   Vaping status: Never Used  Substance  and Sexual Activity   Alcohol use: Yes    Alcohol/week: 3.0 standard drinks of alcohol    Types: 3 Shots of liquor per week    Comment: couple days out of the week   Drug use: No   Sexual activity: Yes    Partners: Male    Birth control/protection: Post-menopausal  Other Topics Concern   Not on file  Social History Narrative   Fun: Watching tv   Denies any religious beliefs effecting health care.    Social Drivers of Corporate investment banker Strain: Not on file  Food Insecurity: Not on file  Transportation Needs: Not on file  Physical Activity: Not on file  Stress: Not on file  Social Connections: Socially Integrated (12/10/2021)   Received from [facility], [facility]   Social Network    How would you rate your social network (family, work, friends)?: Good participation with social networks  Intimate Partner Violence: Not At Risk (12/10/2021)   Received from [employer], [facility]   HITS    Over the last 12 months how often did your partner physically hurt you?:  Never    Over the last 12 months how often did your partner insult you or talk down to you?: Fairly often    Over the last 12 months how often did your partner threaten you with physical harm?: Never    Over the last 12 months how often did your partner scream or curse at you?: Sometimes    Review of Systems  PHYSICAL EXAMINATION:   LMP  (LMP Unknown)     General appearance: alert, cooperative and appears stated age Head: Normocephalic, without obvious abnormality, atraumatic Neck: no adenopathy, supple, symmetrical, trachea midline and thyroid normal to inspection and palpation Lungs: clear to auscultation bilaterally Breasts: normal appearance, no masses or tenderness, No nipple retraction or dimpling, No nipple discharge or bleeding, No axillary or supraclavicular adenopathy Heart: regular rate and rhythm Abdomen: soft, non-tender, no masses,  no organomegaly Extremities: extremities normal, atraumatic, no cyanosis or edema Skin: Skin color, texture, turgor normal. No rashes or lesions Lymph nodes: Cervical, supraclavicular, and axillary nodes normal. No abnormal inguinal nodes palpated Neurologic: Grossly normal  Pelvic: External genitalia:  no lesions              Urethra:  normal appearing urethra with no masses, tenderness or lesions              Bartholins and Skenes: normal                 Vagina: normal appearing vagina with normal color and discharge, no lesions              Cervix: no lesions                Bimanual Exam:  Uterus:  normal size, contour, position, consistency, mobility, non-tender              Adnexa: no mass, fullness, tenderness              Rectal exam: {yes no:314532}.  Confirms.              Anus:  normal sphincter tone, no lesions  Chaperone was present for exam:  {BSCHAPERONE:31226::"Emily F, CMA"}  ASSESSMENT:    PLAN:    {LABS (Optional):23779}  ***  total time was spent for this patient encounter, including preparation, face-to-face  counseling with the patient, coordination of care, and documentation of the encounter.

## 2023-06-26 ENCOUNTER — Ambulatory Visit (INDEPENDENT_AMBULATORY_CARE_PROVIDER_SITE_OTHER)

## 2023-06-26 DIAGNOSIS — N950 Postmenopausal bleeding: Secondary | ICD-10-CM | POA: Diagnosis not present

## 2023-06-26 NOTE — Progress Notes (Signed)
 GYNECOLOGY  VISIT   HPI: 48 y.o.   Single  African American female   [id number] with No LMP recorded (lmp unknown). Patient is postmenopausal.   here for: discuss ultrasound results   Postmenopausal bleeding occurred about 2 weeks ago.   Lasted a couple of days and then stopped.  Her last period was years ago.  LH 53.7, FSH 79.6, E2 < 5 on 12/10/21 [facility]   She has known fibroids.   No change in sexual partner.  Neg GC/CT/trichomonas testing 04/17/23.  She is followed for LGSIL and positive HR HPV.  Her pap 04/17/23 was normal with negative HR HPV.   GYNECOLOGIC HISTORY: No LMP recorded (lmp unknown). Patient is postmenopausal. Contraception:  postmenopausal  Menopausal hormone therapy:  no Last 2 paps:  04/17/23 neg, neg HR HPV, 03/13/22 HPV+ History of abnormal Pap or positive HPV:  yes Mammogram:  05/03/22 [facility] cat 1 neg         OB History     Gravida  2   Para  1   Term      Preterm  1   AB  1   Living  0      SAB  1   IAB  0   Ectopic  0   Multiple  0   Live Births  0              Patient Active Problem List   Diagnosis Date Noted   Amenorrhea due to oral contraceptive 12/05/2020   Family history of colon cancer 09/26/2015   Folliculitis 09/26/2015   Excessive sweating 09/26/2015   Open wound of face 07/07/2014   Current smoker 05/18/2014   Genital herpes 05/11/2013    Past Medical History:  Diagnosis Date   Dysmenorrhea    Fibroids    HSV infection    Hypertension    Inguinal adenopathy    left    Past Surgical History:  Procedure Laterality Date   BREAST BIOPSY     CESAREAN SECTION      Current Outpatient Medications  Medication Sig Dispense Refill   vitamin B-12 (CYANOCOBALAMIN) 1000 MCG tablet Take 1,000 mcg by mouth daily. Reported on 09/26/2015     dicyclomine  (BENTYL ) 20 MG tablet Take 1 tablet (20 mg total) by mouth 2 (two) times daily. (Patient not taking: Reported on 07/02/2023) 20 tablet 0   ibuprofen  (ADVIL )  800 MG tablet Take 1 tablet (800 mg total) by mouth 3 (three) times daily. (Patient not taking: Reported on 07/02/2023) 21 tablet 0   loperamide  (IMODIUM  A-D) 2 MG tablet Take 1 tablet (2 mg total) by mouth 4 (four) times daily as needed for diarrhea or loose stools. (Patient not taking: Reported on 07/02/2023) 30 tablet 0   No current facility-administered medications for this visit.     ALLERGIES: Patient has no known allergies.  Family History  Problem Relation Age of Onset   Hypertension Mother    Kidney disease Mother    Diabetes Maternal Aunt    Colon cancer Maternal Aunt    Hypertension Maternal Grandmother    Breast cancer Neg Hx     Social History   Socioeconomic History   Marital status: Single    Spouse name: Not on file   Number of children: 1   Years of education: 16   Highest education level: Not on file  Occupational History   Occupation: IT sales professional  Tobacco Use   Smoking status: Every Day  Current packs/day: 0.50    Average packs/day: 0.5 packs/day for 10.0 years (5.0 ttl pk-yrs)    Types: Cigarettes   Smokeless tobacco: Never  Vaping Use   Vaping status: Never Used  Substance and Sexual Activity   Alcohol use: Yes    Alcohol/week: 3.0 standard drinks of alcohol    Types: 3 Shots of liquor per week    Comment: couple days out of the week   Drug use: No   Sexual activity: Yes    Partners: Male    Birth control/protection: Post-menopausal  Other Topics Concern   Not on file  Social History Narrative   Fun: Watching tv   Denies any religious beliefs effecting health care.    Social Drivers of Corporate investment banker Strain: Not on file  Food Insecurity: Not on file  Transportation Needs: Not on file  Physical Activity: Not on file  Stress: Not on file  Social Connections: Socially Integrated (12/10/2021)   [facility], [facility]   Social Network    How would you rate your social network (family, work, friends)?:  Good participation with social networks  Intimate Partner Violence: Not At Risk (12/10/2021)   Received from [facility], [facility]   HITS    Over the last 12 months how often did your partner physically hurt you?: Never    Over the last 12 months how often did your partner insult you or talk down to you?: Fairly often    Over the last 12 months how often did your partner threaten you with physical harm?: Never    Over the last 12 months how often did your partner scream or curse at you?: Sometimes    Review of Systems  All other systems reviewed and are negative.   PHYSICAL EXAMINATION:   BP 124/82 (BP Location: Left Arm, Patient Position: Sitting, Cuff Size: Normal)   Pulse 86   Ht 5' 7.5" (1.715 m)   Wt 164 lb (74.4 kg)   LMP  (LMP Unknown)   SpO2 100%   BMI 25.31 kg/m     General appearance: alert, cooperative and appears stated age  Pelvic US  06/26/23: Uterus 7.73 x 4.85 x 3.72 cm.  3 intramural fibroids: 1.02 cm, 1.30 cm, 0.97 cm.  EMS 7.52 mm.  No definite mass, avascular.  Left ovary 2.05 x 1.10 x 1.40 cm.  Normal.  Right ovary 2.26 x 1.10 x 1.10 cm.  Normal.  No adnexal masses.  No free fluid.   ASSESSMENT:  Postmenopausal bleeding.  Thickened endometrium.   PLAN:  US  images and report reviewed.  Etiologies of findings discussed:  polyp, hyperplasia, malignancy.  Return for endometrial biopsy.  Procedure, risks, benefits reviewed.  Potential tx options also discussed dilation and curettage, medication therapy with progesterone, or hysterectomy - all depending on results.   33 min total time was spent for this patient encounter, including preparation, face-to-face counseling with the patient, coordination of care, and documentation of the encounter.

## 2023-07-02 ENCOUNTER — Ambulatory Visit: Admitting: Obstetrics and Gynecology

## 2023-07-02 ENCOUNTER — Ambulatory Visit (INDEPENDENT_AMBULATORY_CARE_PROVIDER_SITE_OTHER): Admitting: Obstetrics and Gynecology

## 2023-07-02 ENCOUNTER — Encounter: Payer: Self-pay | Admitting: Obstetrics and Gynecology

## 2023-07-02 VITALS — BP 124/82 | HR 86 | Ht 67.5 in | Wt 164.0 lb

## 2023-07-02 DIAGNOSIS — N950 Postmenopausal bleeding: Secondary | ICD-10-CM | POA: Diagnosis not present

## 2023-07-02 DIAGNOSIS — R9389 Abnormal findings on diagnostic imaging of other specified body structures: Secondary | ICD-10-CM

## 2023-07-02 NOTE — Patient Instructions (Addendum)
 Uterine Bleeding After Menopause: What It Means Menopause is the end of a female's fertile years. After menopause, your ovaries can no longer release an egg. You'll no longer be able to get pregnant, and you'll no longer get a period. Any type of bleeding after menopause should be checked by a health care provider, as this is not normal. Treatment will depend on the cause of the bleeding. What can cause bleeding after menopause? Your provider may do tests to find out why you're bleeding. You may have bleeding if: You take hormones to treat the symptoms of menopause. The lining of your uterus thins as a result of low estrogen. The lining of your uterus thickens as a result of too much estrogen. You have cancer in the uterus. You have growths in the uterus, such as: Polyps. Fibroids. Follow these instructions at home: Pay attention to any changes in your symptoms. Let your provider know about them. If told by your provider: Avoid using tampons and douches. Get regular pelvic exams, including Pap tests. Take iron supplements. Change your pads regularly. Take your medicines only as told. Contact a health care provider if: You have pain in your belly. You have headaches. You have a fever or chills. You feel faint or dizzy. Get help right away if: You pass large blood clots. You have heavy bleeding that needs more than 1 pad an hour and you've never had this before. This information is not intended to replace advice given to you by your health care provider. Make sure you discuss any questions you have with your health care provider. Document Revised: 01/07/2023 Document Reviewed: 11/04/2022 Elsevier Patient Education  2024 [employer] Inc.  Endometrial Biopsy  An endometrial biopsy is a procedure where a tissue sample is removed from the lining of the uterus. This lining is called the endometrium. The tissue sample is then sent to a lab for testing. You may have this type of biopsy to  check for: Cancer. Infection. Growths called polyps. Uterine bleeding that can't be explained. Tell a health care provider about: Any allergies you have. All medicines you're taking including vitamins, herbs, eye drops, creams, and over-the-counter medicines. Any problems you or family members have had with anesthesia. Any bleeding problems you have. Any surgeries you have had. Any medical problems you have. Whether you're pregnant or may be pregnant. What are the risks? Your health care provider will talk with you about risks. These may include: Bleeding. Infection. Allergic reactions to medicines. Damage to the wall of the uterus. This is rare. What happens before the procedure? Keep track of your period. You may need to have this biopsy when you're not having your period. Ask your provider about: Changing or stopping your regular medicines. These include any diabetes medicines or blood thinners you take. Taking medicines such as aspirin and ibuprofen . These medicines can thin your blood. Do not take them unless your provider tells you to. Taking over-the-counter medicines, vitamins, herbs, and supplements. Bring a pad with you. You may need to wear one after the biopsy. Plan to have a responsible adult take you home from the hospital or clinic. You won't be allowed to drive. What happens during the procedure? A tool will be put into your vagina to hold it open. This helps your provider see the cervix. The cervix is the lowest part of the uterus. Your cervix will be cleaned with a solution that kills germs. You will be given anesthesia. This keeps you from feeling pain. It will numb  your cervix. A tool called forceps will be used to hold your cervix steady. A thin tool called a uterine sound will be put through your cervix. It will be used to: Find the length of your uterus. Find where to take the sample from. A soft tube called a catheter will be put into your uterus. The  catheter will remove a tissue sample. The tube and tools will be removed. The sample will be sent to a lab for testing. The procedure may vary among providers and hospitals. What happens after the procedure? Your blood pressure, heart rate, breathing rate, and blood oxygen level will be monitored until you leave the hospital or clinic. It's up to you to get the results of your procedure. Ask your provider, or the department that is doing the procedure, when your results will be ready. This information is not intended to replace advice given to you by your health care provider. Make sure you discuss any questions you have with your health care provider. Document Revised: 05/07/2022 Document Reviewed: 05/07/2022 Elsevier Patient Education  2024 [employer].

## 2023-07-11 ENCOUNTER — Telehealth: Admitting: Physician Assistant

## 2023-07-11 ENCOUNTER — Encounter (HOSPITAL_COMMUNITY): Payer: Self-pay

## 2023-07-11 ENCOUNTER — Ambulatory Visit (HOSPITAL_COMMUNITY)
Admission: EM | Admit: 2023-07-11 | Discharge: 2023-07-11 | Disposition: A | Discharge status: Home / Self Care | Attending: Family Medicine | Admitting: Family Medicine

## 2023-07-11 DIAGNOSIS — R197 Diarrhea, unspecified: Secondary | ICD-10-CM | POA: Diagnosis present

## 2023-07-11 DIAGNOSIS — J029 Acute pharyngitis, unspecified: Secondary | ICD-10-CM | POA: Diagnosis present

## 2023-07-11 DIAGNOSIS — B370 Candidal stomatitis: Secondary | ICD-10-CM | POA: Diagnosis present

## 2023-07-11 DIAGNOSIS — K1329 Other disturbances of oral epithelium, including tongue: Secondary | ICD-10-CM

## 2023-07-11 LAB — POCT RAPID STREP A (OFFICE): Rapid Strep A Screen: NEGATIVE

## 2023-07-11 MED ORDER — FLUCONAZOLE 100 MG PO TABS: ORAL_TABLET | ORAL | 0 refills | Status: AC

## 2023-07-11 NOTE — ED Triage Notes (Signed)
 Patient here today with c/o scratchy throat on 07/09/2023. Patient noticed white spots on the back of her throat yesterday. She has also had some fatigue. No known sick contacts.

## 2023-07-11 NOTE — ED Provider Notes (Signed)
 [facility]    CSN: [id number] Arrival date & time: 07/11/23  1129      History   Chief Complaint Chief Complaint  Patient presents with   Sore Throat    White spots    HPI Tiffany [name] is a 48 y.o. female.    Sore Throat  Here for sore throat that began on the morning of April 30.  She then noticed some white patches on her soft palate yesterday.  She had a video visit this morning and was asked to go to urgent care for further evaluation and possible culture.  She has not noted any fever or chills, but her temperature here is 99.6.  She has some intermittent chronic cough that she attributes to smoking. Also may be some chronic mild nasal drainage.  She has had some diarrhea in the last couple of days but it is bothering her intermittently for the last few weeks.  She is postmenopausal  NKDA  She does not have a primary care though she has a gynecologist.    Past Medical History:  Diagnosis Date   Dysmenorrhea    Fibroids    HSV infection    Hypertension    Inguinal adenopathy    left    Patient Active Problem List   Diagnosis Date Noted   Amenorrhea due to oral contraceptive 12/05/2020   Family history of colon cancer 09/26/2015   Folliculitis 09/26/2015   Excessive sweating 09/26/2015   Open wound of face 07/07/2014   Current smoker 05/18/2014   Genital herpes 05/11/2013    Past Surgical History:  Procedure Laterality Date   BREAST BIOPSY     CESAREAN SECTION      OB History     Gravida  2   Para  1   Term      Preterm  1   AB  1   Living  0      SAB  1   IAB  0   Ectopic  0   Multiple  0   Live Births  0            Home Medications    Prior to Admission medications   Medication Sig Start Date End Date Taking? Authorizing Provider  fluconazole  (DIFLUCAN ) 100 MG tablet 2 tablets by mouth the first day, then 1 tablet daily for 6 more days. 07/11/23  Yes Ann Keto, MD  dicyclomine  (BENTYL ) 20 MG  tablet Take 1 tablet (20 mg total) by mouth 2 (two) times daily. Patient not taking: Reported on 07/02/2023 06/16/23   Reddick, Johnathan B, NP  loperamide  (IMODIUM  A-D) 2 MG tablet Take 1 tablet (2 mg total) by mouth 4 (four) times daily as needed for diarrhea or loose stools. Patient not taking: Reported on 07/02/2023 06/16/23   Alease Hunter, NP    Family History Family History  Problem Relation Age of Onset   Hypertension Mother    Kidney disease Mother    Diabetes Maternal Aunt    Colon cancer Maternal Aunt    Hypertension Maternal Grandmother    Breast cancer Neg Hx     Social History Social History   Tobacco Use   Smoking status: Every Day    Current packs/day: 0.50    Average packs/day: 0.5 packs/day for 10.0 years (5.0 ttl pk-yrs)    Types: Cigarettes   Smokeless tobacco: Never  Vaping Use   Vaping status: Never Used  Substance Use Topics   Alcohol use:  Yes    Alcohol/week: 3.0 standard drinks of alcohol    Types: 3 Shots of liquor per week    Comment: couple days out of the week   Drug use: No     Allergies   Patient has no known allergies.   Review of Systems Review of Systems   Physical Exam Triage Vital Signs ED Triage Vitals  Encounter Vitals Group     BP 07/11/23 1147 (!) 182/121     Systolic BP Percentile --      Diastolic BP Percentile --      Pulse Rate 07/11/23 1147 97     Resp 07/11/23 1147 16     Temp 07/11/23 1147 99.6 F (37.6 C)     Temp Source 07/11/23 1147 Oral     SpO2 07/11/23 1147 96 %     Weight 07/11/23 1148 165 lb (74.8 kg)     Height 07/11/23 1148 5' 7.5" (1.715 m)     Head Circumference --      Peak Flow --      Pain Score 07/11/23 1148 0     Pain Loc --      Pain Education --      Exclude from Growth Chart --    No data found.  Updated Vital Signs BP (!) 182/121 (BP Location: Left Arm)   Pulse 97   Temp 99.6 F (37.6 C) (Oral)   Resp 16   Ht 5' 7.5" (1.715 m)   Wt 74.8 kg   LMP  (LMP Unknown)   SpO2 96%    BMI 25.46 kg/m   Visual Acuity Right Eye Distance:   Left Eye Distance:   Bilateral Distance:    Right Eye Near:   Left Eye Near:    Bilateral Near:     Physical Exam Vitals reviewed.  Constitutional:      General: She is not in acute distress.    Appearance: She is not toxic-appearing.  HENT:     Right Ear: Tympanic membrane and ear canal normal.     Left Ear: Tympanic membrane and ear canal normal.     Nose: Nose normal.     Mouth/Throat:     Mouth: Mucous membranes are moist.     Comments: There are adherent white patches on her soft palate.  No blistering or ulcers at this time.  The tonsillar hypertrophy and there is some very mild erythema of the posterior oropharynx.  There are no other white exudates on her buccal mucosa or on the tongue. Eyes:     Extraocular Movements: Extraocular movements intact.     Conjunctiva/sclera: Conjunctivae normal.     Pupils: Pupils are equal, round, and reactive to light.  Cardiovascular:     Rate and Rhythm: Normal rate and regular rhythm.     Heart sounds: No murmur heard. Pulmonary:     Effort: Pulmonary effort is normal. No respiratory distress.     Breath sounds: No stridor. No wheezing, rhonchi or rales.  Musculoskeletal:     Cervical back: Neck supple.  Lymphadenopathy:     Cervical: No cervical adenopathy.  Skin:    Capillary Refill: Capillary refill takes less than 2 seconds.     Coloration: Skin is not jaundiced or pale.  Neurological:     General: No focal deficit present.     Mental Status: She is alert and oriented to person, place, and time.  Psychiatric:        Behavior: Behavior normal.  UC Treatments / Results  Labs (all labs ordered are listed, but only abnormal results are displayed) Labs Reviewed  CULTURE, GROUP A STREP [facility])  POCT RAPID STREP A (OFFICE)    EKG   Radiology No results found.  Procedures Procedures (including critical care time)  Medications Ordered in UC Medications -  No data to display  Initial Impression / Assessment and Plan / UC Course  I have reviewed the triage vital signs and the nursing notes.  Pertinent labs & imaging results that were available during my care of the patient were reviewed by me and considered in my medical decision making (see chart for details).     Rapid strep is negative.  Throat culture is sent and we will notify and treat protocol if that is positive  Fluconazole  is sent in to treat in case this is actually thrush.  Staff will help her set up a PCP appointment.  She is given contact information for ENT.  Final Clinical Impressions(s) / UC Diagnoses   Final diagnoses:  Sore throat  Candidiasis of mouth  Diarrhea, unspecified type     Discharge Instructions      Your strep test is negative.  Culture of the throat will be sent, and staff will notify you if that is in turn positive.  Fluconazole  100 mg--2 tablets by mouth the first day then 1 tablet by mouth daily for 6 more days.  You can use the QR code/website at the back of the summary paperwork to schedule yourself a new patient appointment with primary care       ED Prescriptions     Medication Sig Dispense Auth. Provider   fluconazole  (DIFLUCAN ) 100 MG tablet 2 tablets by mouth the first day, then 1 tablet daily for 6 more days. 8 tablet Monico Sudduth K, MD      PDMP not reviewed this encounter.   Ann Keto, MD 07/11/23 [phone]

## 2023-07-11 NOTE — Discharge Instructions (Signed)
 Your strep test is negative.  Culture of the throat will be sent, and staff will notify you if that is in turn positive.  Fluconazole  100 mg--2 tablets by mouth the first day then 1 tablet by mouth daily for 6 more days.  You can use the QR code/website at the back of the summary paperwork to schedule yourself a new patient appointment with primary care

## 2023-07-11 NOTE — Patient Instructions (Signed)
 [name], thank you for joining Angelia Kelp, PA-C for today's virtual visit.  While this provider is not your primary care provider (PCP), if your PCP is located in our provider database this encounter information will be shared with them immediately following your visit.   A [facility] MyChart account gives you access to today's visit and all your visits, tests, and labs performed at [facility] " click here if you don't have a [facility] MyChart account or go to [email or web address]  Consent: (Patient) Tiffany [name] provided verbal consent for this virtual visit at the beginning of the encounter.  Current Medications:  Current Outpatient Medications:    dicyclomine  (BENTYL ) 20 MG tablet, Take 1 tablet (20 mg total) by mouth 2 (two) times daily. (Patient not taking: Reported on 07/02/2023), Disp: 20 tablet, Rfl: 0   ibuprofen  (ADVIL ) 800 MG tablet, Take 1 tablet (800 mg total) by mouth 3 (three) times daily. (Patient not taking: Reported on 07/02/2023), Disp: 21 tablet, Rfl: 0   loperamide  (IMODIUM  A-D) 2 MG tablet, Take 1 tablet (2 mg total) by mouth 4 (four) times daily as needed for diarrhea or loose stools. (Patient not taking: Reported on 07/02/2023), Disp: 30 tablet, Rfl: 0   vitamin B-12 (CYANOCOBALAMIN) 1000 MCG tablet, Take 1,000 mcg by mouth daily. Reported on 09/26/2015, Disp: , Rfl:    Medications ordered in this encounter:  No orders of the defined types were placed in this encounter.    *If you need refills on other medications prior to your next appointment, please contact your pharmacy*  Follow-Up: Call back or seek an in-person evaluation if the symptoms worsen or if the condition fails to improve as anticipated.  [facility] [phone]  Other Instructions  Oral Thrush, Adult Oral thrush, also called oral candidiasis, is a fungal infection that develops in the mouth and throat and on the tongue. It causes white patches to  form in the mouth and on the tongue. Many cases of thrush are mild, but this infection can also be serious. Olla Bevels can be a repeated (recurrent) problem for certain people who have a weak body defense system (immune system). The weakness can be caused by chronic illnesses, or by taking medicines that limit the body's ability to fight infection. If a person has difficulty fighting infection, the fungus that causes thrush can spread through the body. This can cause life-threatening blood or organ infections. What are the causes? This condition is caused by a fungus (yeast) called Candida albicans. This fungus is normally present in small amounts in the mouth and on other mucous membranes. It usually causes no harm. If conditions are present that allow the fungus to grow without control, it invades surrounding tissues and becomes an infection. Other Candida species can also lead to thrush, though this is rare. What increases the risk? The following factors may make you more likely to develop this condition: Having a weakened immune system. Being an older adult. Having diabetes, cancer, or HIV (human immunodeficiency virus). Having dry mouth (xerostomia). Being pregnant or breastfeeding. Having poor dental care, especially in those who have dentures. Using antibiotic or steroid medicines. What are the signs or symptoms? Symptoms of this condition can vary from mild and moderate to severe and persistent. Symptoms may include: A burning feeling in the mouth and throat. This can occur at the start of a thrush infection. White patches that stick to the mouth and tongue. The tissue around the patches may be red, raw,  and painful. If rubbed (during tooth brushing, for example), the patches and the tissue of the mouth may bleed easily. A bad taste in the mouth or difficulty tasting foods. A cottony feeling in the mouth. Pain during eating and swallowing. Poor appetite. Cracking at the corners of the  mouth. How is this diagnosed? This condition is diagnosed based on: A physical exam. Your medical history. How is this treated? This condition is treated with medicines called antifungals, which prevent the growth of fungi. These medicines are either applied directly to the affected area (topical) or swallowed (oral). The treatment will depend on the severity of the condition. Mild cases of thrush may be treated with an antifungal mouth rinse or lozenges. Treatment usually lasts about 14 days. Moderate to severe cases of thrush can be treated with oral antifungal medicine, if they have spread to the esophagus. A topical antifungal medicine may also be used. For some severe infections, treatment may need to continue for more than 14 days. Oral antifungal medicines are rarely used during pregnancy because they may be harmful to the unborn child. If you are pregnant, talk with your health care provider about options for treatment. Persistent or recurrent thrush. For cases of thrush that do not go away or keep coming back: Treatment may be needed twice as long as the symptoms last. Treatment will include both oral and topical antifungal medicines. People with a weakened immune system can take an antifungal medicine on a continuous basis to prevent thrush infections. It is important to treat conditions that make a person more likely to get thrush, such as diabetes or HIV. Follow these instructions at home: Relieving soreness and discomfort To help reduce the discomfort of thrush: Drink cold liquids such as water or iced tea. Try flavored ice treats or frozen juices. Eat foods that are easy to swallow, such as gelatin, ice cream, or custard. Try drinking from a straw if the patches in your mouth are painful.  General instructions Take or use over-the-counter and prescription medicines only as told by your health care provider. Eat plain, unflavored yogurt as directed by your health care provider.  Check the label to make sure the yogurt contains live cultures. This yogurt can help healthy bacteria grow in the mouth and can stop the growth of the fungus that causes thrush. If you wear dentures, remove the dentures before going to bed, brush them vigorously, and soak them in a cleaning solution as directed by your health care provider. Rinse your mouth with a warm salt-water mixture several times a day. To make a salt-water mixture, dissolve -1 tsp (3-6 g) of salt in 1 cup (237 mL) of warm water. Contact a health care provider if: Your symptoms are getting worse or are not improving within 7 days of starting treatment. You have symptoms of a spreading infection, such as white patches on the skin outside of the mouth. You are breastfeeding your baby and you have redness and pain in the nipples. Summary Oral thrush, also called oral candidiasis, is a fungal infection that develops in the mouth and throat and on the tongue. It causes white patches to form in the mouth and on the tongue. You are more likely to get this condition if you have a weakened immune system or an underlying condition, such as HIV, cancer, or diabetes. This condition is treated with medicines called antifungals, which prevent the growth of fungi. Contact a health care provider if your symptoms do not improve, or get worse,  within 7 days of starting treatment. This information is not intended to replace advice given to you by your health care provider. Make sure you discuss any questions you have with your health care provider. Document Revised: 02/11/2022 Document Reviewed: 02/11/2022 Elsevier Patient Education  2024 Elsevier Inc.   Leukoplakia Leukoplakia refers to white patches that develop in the mouth. These patches may show up on the insides of the cheeks, on the lips, on or under the tongue, or on the gums. Leukoplakia can also develop on the genitals or in the area around the anus, but this is rare. Leukoplakia  usually goes away with treatment. In some cases, leukoplakia can indicate an increased risk for cancer. What are the causes? Many conditions can cause or increase the risk for leukoplakia in the mouth. These may include: Any type of tobacco use, especially when combined with the use of alcohol. Irritation of the mouth from rough teeth or dentures. Having a weakened disease-fighting system (immune system). What are the signs or symptoms?  The main symptom of this condition is the development of white patches or flat areas in the mouth. The patches may have an odd shape and may be hard or raised. The patches may also be: White, gray, or speckled red and white in color. Some areas may be reddened. Hard to wipe or scrape away. Scraping the patches may cause bleeding. Sensitive to touch, heat, or foods that are spicy or acidic. How is this diagnosed? This condition is diagnosed based on: A physical exam. Your health care provider can usually make a diagnosis by closely examining the affected area. A test in which a sample of affected skin is removed and then checked under a microscope (biopsy). This test is used to confirm the diagnosis and to rule out other serious conditions, such as cancer. How is this treated? Treatment for this condition may include: Stopping tobacco use. Repairing any rough teeth or dentures. Surgery to remove the patches. This may be done using a surgical knife (scalpel), laser, heat, or cold. Medicines that can be taken by mouth or applied to the patches. Follow these instructions at home: Eating and drinking Avoid foods or drinks that irritate the patches. Eat a healthy diet including fresh fruits and vegetables, lean proteins, and whole grains. General instructions  Take or apply over-the-counter and prescription medicines only as told by your health care provider. Check with your dentist to see if you need any repairs to teeth or dentures. Do not use any products  that contain nicotine or tobacco. These products include cigarettes, chewing tobacco, and vaping devices, such as e-cigarettes. If you need help quitting, ask your health care provider. Alcohol use Do not drink alcohol if: Your health care provider tells you not to drink. You are pregnant, may be pregnant, or are planning to become pregnant. If you drink alcohol: Limit how much you have to: 0-1 drink a day for women. 0-2 drinks a day for men. Know how much alcohol is in your drink. In the [place] one drink equals one 12 oz bottle of beer (355 mL), one 5 oz glass of wine (148 mL), or one 1 oz glass of hard liquor (44 mL). Contact a health care provider if: You develop new patches of leukoplakia. You notice changes in the size, shape, or feel of existing patches. You have a fever. You have severe pain in the area of a patch, and the pain is not helped by prescribed medicine. Get help right away if:  You have bleeding in the area of a patch, and you cannot stop the bleeding. You have a patch in your mouth that becomes so swollen that you have trouble eating or breathing. This information is not intended to replace advice given to you by your health care provider. Make sure you discuss any questions you have with your health care provider. Document Revised: 07/26/2021 Document Reviewed: 07/26/2021 Elsevier Patient Education  2024 Elsevier Inc.   If you have been instructed to have an in-person evaluation today at a local Urgent Care facility, please use the link below. It will take you to a list of all of our available [facility], including address, phone number and hours of operation. Please do not delay care.  [facility]  If you or a family member do not have a primary care provider, use the link below to schedule a visit and establish care. When you choose a [facility] primary care physician or advanced practice provider, you gain a long-term partner in  health. Find a Primary Care Provider  Learn more about [facility] in-office and virtual care options: [facility] - Get Care Now

## 2023-07-11 NOTE — Progress Notes (Signed)
 Virtual Visit Consent   Tiffany [name], you are scheduled for a virtual visit with a [facility] provider today. Just as with appointments in the office, your consent must be obtained to participate. Your consent will be active for this visit and any virtual visit you may have with one of our providers in the next 365 days. If you have a MyChart account, a copy of this consent can be sent to you electronically.  As this is a virtual visit, video technology does not allow for your provider to perform a traditional examination. This may limit your provider's ability to fully assess your condition. If your provider identifies any concerns that need to be evaluated in person or the need to arrange testing (such as labs, EKG, etc.), we will make arrangements to do so. Although advances in technology are sophisticated, we cannot ensure that it will always work on either your end or our end. If the connection with a video visit is poor, the visit may have to be switched to a telephone visit. With either a video or telephone visit, we are not always able to ensure that we have a secure connection.  By engaging in this virtual visit, you consent to the provision of healthcare and authorize for your insurance to be billed (if applicable) for the services provided during this visit. Depending on your insurance coverage, you may receive a charge related to this [name].  I need to obtain your verbal consent now. Are you willing to proceed with your visit today? Tiffany [name] has provided verbal consent on 07/11/2023 for a virtual visit (video or telephone). Angelia Kelp, PA-C  Date: 07/11/2023 10:13 AM   Virtual Visit via Video Note   I, Angelia Kelp, connected with  Tiffany [name]  ([id number], 12-23-1975) on 07/11/23 at  9:45 AM EDT by a video-enabled telemedicine application and verified that I am speaking with the correct person using two identifiers.  Location: Patient: Virtual Visit Location  Patient: Home Provider: Virtual Visit Location Provider: Home Office   I discussed the limitations of evaluation and management by telemedicine and the availability of in person appointments. The patient expressed understanding and agreed to proceed.    History of Present Illness: Tiffany [name] is a 48 y.o. who identifies as a female who was assigned female at birth, and is being seen today for white patches in the roof of the mouth.  Did have a mild scratchy throat prior to onset, but did not develop any general unwell feeling after. Denies sore throat, sore mouth, fevers, chills, nasal congestion, chest congestion, ear pain, headaches, cough.  Denies any recent steroid, antibiotic, or inhaler use.   Has had some increased stress.   Problems:  Patient Active Problem List   Diagnosis Date Noted   Amenorrhea due to oral contraceptive 12/05/2020   Family history of colon cancer 09/26/2015   Folliculitis 09/26/2015   Excessive sweating 09/26/2015   Open wound of face 07/07/2014   Current smoker 05/18/2014   Genital herpes 05/11/2013    Allergies: No Known Allergies Medications:  Current Outpatient Medications:    dicyclomine  (BENTYL ) 20 MG tablet, Take 1 tablet (20 mg total) by mouth 2 (two) times daily. (Patient not taking: Reported on 07/02/2023), Disp: 20 tablet, Rfl: 0   ibuprofen  (ADVIL ) 800 MG tablet, Take 1 tablet (800 mg total) by mouth 3 (three) times daily. (Patient not taking: Reported on 07/02/2023), Disp: 21 tablet, Rfl: 0   loperamide  (IMODIUM  A-D) 2 MG tablet, Take  1 tablet (2 mg total) by mouth 4 (four) times daily as needed for diarrhea or loose stools. (Patient not taking: Reported on 07/02/2023), Disp: 30 tablet, Rfl: 0   vitamin B-12 (CYANOCOBALAMIN) 1000 MCG tablet, Take 1,000 mcg by mouth daily. Reported on 09/26/2015, Disp: , Rfl:   Observations/Objective: Patient is well-developed, well-nourished in no acute distress.  Resting comfortably at home.  Head is  normocephalic, atraumatic.  No labored breathing.  Speech is clear and coherent with logical content.  Patient is alert and oriented at baseline.    Assessment and Plan: 1. White patches on oral mucosa (Primary)  - DDx: Thrush vs Leukoplakia - Does report she had one area she did flick off with her nail, so most likely thrush, but when discussing medications, patient decided she preferred in-person evaluation for more definitive diagnosis and to have culture or biopsy obtained to differentiate. This is reasonable and provider agrees.  Recommendation for in-person exam at local Urgent Care  Follow Up Instructions: I discussed the assessment and treatment plan with the patient. The patient was provided an opportunity to ask questions and all were answered. The patient agreed with the plan and demonstrated an understanding of the instructions.  A copy of instructions were sent to the patient via MyChart unless otherwise noted below.    The patient was advised to call back or seek an in-person evaluation if the symptoms worsen or if the condition fails to improve as anticipated.    Angelia Kelp, PA-C

## 2023-07-14 LAB — CULTURE, GROUP A STREP (THRC)

## 2023-07-17 ENCOUNTER — Other Ambulatory Visit: Admitting: Obstetrics and Gynecology

## 2023-07-17 ENCOUNTER — Other Ambulatory Visit

## 2023-07-18 ENCOUNTER — Ambulatory Visit (INDEPENDENT_AMBULATORY_CARE_PROVIDER_SITE_OTHER): Admitting: Physician Assistant

## 2023-07-18 ENCOUNTER — Encounter: Payer: Self-pay | Admitting: Obstetrics and Gynecology

## 2023-07-18 VITALS — BP 180/98 | HR 110 | Temp 98.1°F | Ht 67.0 in | Wt 155.8 lb

## 2023-07-18 DIAGNOSIS — N950 Postmenopausal bleeding: Secondary | ICD-10-CM | POA: Diagnosis not present

## 2023-07-18 DIAGNOSIS — R634 Abnormal weight loss: Secondary | ICD-10-CM

## 2023-07-18 DIAGNOSIS — R197 Diarrhea, unspecified: Secondary | ICD-10-CM | POA: Diagnosis not present

## 2023-07-18 DIAGNOSIS — R42 Dizziness and giddiness: Secondary | ICD-10-CM | POA: Diagnosis not present

## 2023-07-18 DIAGNOSIS — I10 Essential (primary) hypertension: Secondary | ICD-10-CM

## 2023-07-18 DIAGNOSIS — Z23 Encounter for immunization: Secondary | ICD-10-CM

## 2023-07-18 LAB — CBC WITH DIFFERENTIAL/PLATELET
Basophils Absolute: 0 10*3/uL (ref 0.0–0.1)
Basophils Relative: 0.4 % (ref 0.0–3.0)
Eosinophils Absolute: 0 10*3/uL (ref 0.0–0.7)
Eosinophils Relative: 0.7 % (ref 0.0–5.0)
HCT: 38.3 % (ref 36.0–46.0)
Hemoglobin: 13 g/dL (ref 12.0–15.0)
Lymphocytes Relative: 18.2 % (ref 12.0–46.0)
Lymphs Abs: 1.2 10*3/uL (ref 0.7–4.0)
MCHC: 33.8 g/dL (ref 30.0–36.0)
MCV: 113.7 fl — ABNORMAL HIGH (ref 78.0–100.0)
Monocytes Absolute: 0.7 10*3/uL (ref 0.1–1.0)
Monocytes Relative: 10.6 % (ref 3.0–12.0)
Neutro Abs: 4.6 10*3/uL (ref 1.4–7.7)
Neutrophils Relative %: 70.1 % (ref 43.0–77.0)
Platelets: 226 10*3/uL (ref 150.0–400.0)
RBC: 3.37 Mil/uL — ABNORMAL LOW (ref 3.87–5.11)
RDW: 17.7 % — ABNORMAL HIGH (ref 11.5–15.5)
WBC: 6.5 10*3/uL (ref 4.0–10.5)

## 2023-07-18 LAB — COMPREHENSIVE METABOLIC PANEL WITH GFR
ALT: 23 U/L (ref 0–35)
AST: 28 U/L (ref 0–37)
Albumin: 4.2 g/dL (ref 3.5–5.2)
Alkaline Phosphatase: 106 U/L (ref 39–117)
BUN: 10 mg/dL (ref 6–23)
CO2: 27 meq/L (ref 19–32)
Calcium: 9.1 mg/dL (ref 8.4–10.5)
Chloride: 102 meq/L (ref 96–112)
Creatinine, Ser: 0.78 mg/dL (ref 0.40–1.20)
GFR: 91.67 mL/min (ref >60.00)
Glucose, Bld: 101 mg/dL — ABNORMAL HIGH (ref 70–99)
Potassium: 4.1 meq/L (ref 3.5–5.1)
Sodium: 140 meq/L (ref 135–145)
Total Bilirubin: 0.4 mg/dL (ref 0.2–1.2)
Total Protein: 6.6 g/dL (ref 6.0–8.3)

## 2023-07-18 LAB — MAGNESIUM: Magnesium: 2.1 mg/dL (ref 1.5–2.5)

## 2023-07-18 LAB — C-REACTIVE PROTEIN: CRP: <1 mg/dL (ref 0.5–20.0)

## 2023-07-18 LAB — IBC + FERRITIN
Ferritin: 81.5 ng/mL (ref 10.0–291.0)
Iron: 94 ug/dL (ref 42–145)
Saturation Ratios: 26.3 % (ref 20.0–50.0)
TIBC: 357 ug/dL (ref 250.0–450.0)
Transferrin: 255 mg/dL (ref 212.0–360.0)

## 2023-07-18 LAB — TSH: TSH: 1.07 u[IU]/mL (ref 0.35–5.50)

## 2023-07-18 NOTE — Progress Notes (Signed)
 Patient ID: Tiffany [name], female    DOB: 08/16/1975, 48 y.o.   MRN: [id number]   Assessment & Plan:  Diarrhea, unspecified type -     CBC with Differential/Platelet -     IBC + Ferritin -     Comprehensive metabolic panel with GFR -     TSH -     C-reactive protein -     Magnesium -     GI Profile, Stool, PCR -     Ova and parasite examination -     Clostridium difficile culture-fecal  Abnormal weight loss -     CBC with Differential/Platelet -     IBC + Ferritin -     Comprehensive metabolic panel with GFR -     TSH -     C-reactive protein -     Magnesium -     GI Profile, Stool, PCR -     Ova and parasite examination -     Clostridium difficile culture-fecal  Dizziness -     CBC with Differential/Platelet -     IBC + Ferritin -     Comprehensive metabolic panel with GFR -     TSH -     C-reactive protein -     Magnesium  Postmenopausal bleeding -     CBC with Differential/Platelet -     IBC + Ferritin  Essential hypertension  Immunization due -     Tdap vaccine greater than or equal to 7yo IM      Assessment & Plan Diarrhea Persistent diarrhea for one month with cramping, mucous stools, and occasional green color. Possible viral gastroenteritis or other gastrointestinal infection. Potential exposure from nursing home visit with her uncle there. - Order stool test for infection or virus. - Refer to gastroenterology for further evaluation. - Advise ER visit for severe symptoms such as dizziness, lightheadedness, syncope, hematochezia, or severe abdominal pain.  Weight loss Unintentional weight loss of at least 10 pounds since onset of gastrointestinal symptoms, likely due to decreased appetite and frequent diarrhea.  Dizziness Dizziness and imbalance possibly related to electrolyte imbalances due to diarrhea. - Order blood work to check potassium, magnesium, and white count.  Postmenopausal bleeding Postmenopausal bleeding with bright red blood.  Endometrial biopsy scheduled for May 20th. Unclear if related to gastrointestinal symptoms. - Check labs for urgent issues. - Advise to keep all scheduled appointments, including endometrial biopsy.  Elevated blood pressure Elevated blood pressure reading. Asymptomatic. Not on BP medication. Asked her to monitor and recheck with me in 2 weeks.      Return in about 2 weeks (around 08/01/2023) for recheck/follow-up, blood pressure check.    Subjective:    Chief Complaint  Patient presents with   New Patient (Initial Visit)    New pt in office to est care with PCP; pt having concerns with needing a referral to Gastroenterology due to IBS; pt having frequent diarrhea; pt has appt but still stating office needs the referral; pt states past menopause but having some spotting, spoke with GYN and recommended endometrium biopsy.     HPI Discussed the use of AI scribe software for clinical note transcription with the patient, who gave verbal consent to proceed.  History of Present Illness Tiffany [name] is a 48 year old female who presents with gastrointestinal symptoms and postmenopausal bleeding.  She has experienced gastrointestinal symptoms for a month, worsening in the past three weeks. Diarrhea occurs frequently, with over ten episodes daily, and stools  are runny, mucousy, and occasionally green. Even water intake triggers bowel movements. Her appetite is decreased, and she has lost approximately ten pounds. Stools are liquid to semi-solid, with no blood present. She feels dizzy and off-balance over the past week. No recent travel or fish consumption. No recent antibiotic use except for a resolved yeast infection.  Postmenopausal bleeding began around the same time as the gastrointestinal symptoms. Initially spotting, it stopped and then resumed as bright red bleeding. She is confident the bleeding is vaginal. An endometrial biopsy is scheduled for May 20th.   Her father has a history of  vertigo. She has not had abdominal surgeries and retains her gallbladder. She has been under stress recently, which she believes may have affected her blood pressure. She has not been in contact with anyone with similar symptoms and has not visited nursing homes or hospitals recently, except for brief visits to her uncle in a nursing home while wearing a mask.     Past Medical History:  Diagnosis Date   Dysmenorrhea    Fibroids    GERD (gastroesophageal reflux disease) 2023   HSV infection    Hypertension    Inguinal adenopathy    left    Past Surgical History:  Procedure Laterality Date   BREAST BIOPSY     CESAREAN SECTION      Family History  Problem Relation Age of Onset   Hypertension Mother    Kidney disease Mother    Diabetes Maternal Aunt    Colon cancer Maternal Aunt    Hypertension Maternal Grandmother    Breast cancer Neg Hx     Social History   Tobacco Use   Smoking status: Every Day    Current packs/day: 0.50    Average packs/day: 0.5 packs/day for 10.0 years (5.0 ttl pk-yrs)    Types: Cigarettes   Smokeless tobacco: Never  Vaping Use   Vaping status: Never Used  Substance Use Topics   Alcohol use: Yes    Alcohol/week: 3.0 standard drinks of alcohol    Types: 3 Shots of liquor per week    Comment: couple days out of the week   Drug use: No     No Known Allergies  Review of Systems NEGATIVE UNLESS OTHERWISE INDICATED IN HPI      Objective:     BP (!) 180/98 (BP Location: Left Arm, Patient Position: Sitting, Cuff Size: Normal)   Pulse (!) 110   Temp 98.1 F (36.7 C) (Temporal)   Ht 5\' 7"  (1.702 m)   Wt 155 lb 12.8 oz (70.7 kg)   LMP  (LMP Unknown)   SpO2 98%   BMI 24.40 kg/m   Wt Readings from Last 3 Encounters:  07/18/23 155 lb 12.8 oz (70.7 kg)  07/11/23 165 lb (74.8 kg)  07/02/23 164 lb (74.4 kg)    BP Readings from Last 3 Encounters:  07/18/23 (!) 180/98  07/11/23 (!) 182/121  07/02/23 124/82     Physical Exam Vitals  and nursing note reviewed.  Constitutional:      Appearance: Normal appearance. She is normal weight. She is not toxic-appearing.  HENT:     Head: Normocephalic and atraumatic.     Right Ear: External ear normal.     Left Ear: External ear normal.  Eyes:     Extraocular Movements: Extraocular movements intact.     Conjunctiva/sclera: Conjunctivae normal.     Pupils: Pupils are equal, round, and reactive to light.  Cardiovascular:  Rate and Rhythm: Normal rate and regular rhythm.     Pulses: Normal pulses.     Heart sounds: Normal heart sounds.  Pulmonary:     Effort: Pulmonary effort is normal.     Breath sounds: Normal breath sounds.  Abdominal:     General: Abdomen is flat. Bowel sounds are normal. There is no distension.     Palpations: Abdomen is soft. There is no mass.     Tenderness: There is no abdominal tenderness. There is no right CVA tenderness, left CVA tenderness, guarding or rebound.  Musculoskeletal:        General: Normal range of motion.     Cervical back: Normal range of motion and neck supple.  Skin:    General: Skin is warm and dry.  Neurological:     General: No focal deficit present.     Mental Status: She is alert and oriented to person, place, and time.  Psychiatric:        Mood and Affect: Mood normal.        Behavior: Behavior normal.             Sakara Lehtinen M Trellis Guirguis, PA-C

## 2023-07-18 NOTE — Telephone Encounter (Signed)
 Spoke w/ the pt and she reported that only bright red when wiping and feels moist down below but denies heavy bleeding or pain just really anxious that she was told she shouldn't be bleeding and the fact that she is an EMB is not scheduled until 5/20. Pt demanded if she e seen today  Advised the pt at the time of the call of bleeding/pain precautions, will inquire with appt desk about sooner availability/cancellation list, and will provide Dr. Colvin Dec with an update. She voiced understanding.   Msg sent to appt desk.

## 2023-07-18 NOTE — Patient Instructions (Signed)
 Welcome to [facility]! It was a pleasure meeting you today.  Labs and stool testing  Tdap today  Monitor your BP readings  ER if acute / worse changes in symptoms   PLEASE NOTE:  If you had any LAB tests please let us  know if you have not heard back within a few days. You may see your results on MyChart before we have a chance to review them but we will give you a call once they are reviewed by us . If we ordered any REFERRALS today, please let us  know if you have not heard from their office within the next two weeks. Let us  know through MyChart if you are needing REFILLS, or have your pharmacy send us  the request. You can also use MyChart to communicate with me or any office staff.  Please try these tips to maintain a healthy lifestyle:  Eat most of your calories during the day when you are active. Eliminate processed foods including packaged sweets (pies, cakes, cookies), reduce intake of potatoes, white bread, white pasta, and white rice. Look for whole grain options, oat flour or almond flour.  Each meal should contain half fruits/vegetables, one quarter protein, and one quarter carbs (no bigger than a computer mouse).  Cut down on sweet beverages. This includes juice, soda, and sweet tea. Also watch fruit intake, though this is a healthier sweet option, it still contains natural sugar! Limit to 3 servings daily.  Drink at least 1 glass of water with each meal and aim for at least 8 glasses (64 ounces) per day.  Exercise at least 150 minutes every week to the best of your ability.    Take Care,  Jovany Disano, PA-C

## 2023-07-21 ENCOUNTER — Encounter: Payer: Self-pay | Admitting: Physician Assistant

## 2023-07-21 ENCOUNTER — Encounter: Payer: Self-pay | Admitting: Obstetrics and Gynecology

## 2023-07-21 ENCOUNTER — Ambulatory Visit (INDEPENDENT_AMBULATORY_CARE_PROVIDER_SITE_OTHER): Admitting: Obstetrics and Gynecology

## 2023-07-21 ENCOUNTER — Other Ambulatory Visit (HOSPITAL_COMMUNITY)
Admission: RE | Admit: 2023-07-21 | Discharge: 2023-07-21 | Disposition: A | Discharge status: Home / Self Care | Source: Ambulatory Visit | Attending: Obstetrics and Gynecology | Admitting: Obstetrics and Gynecology

## 2023-07-21 DIAGNOSIS — R9389 Abnormal findings on diagnostic imaging of other specified body structures: Secondary | ICD-10-CM | POA: Diagnosis not present

## 2023-07-21 DIAGNOSIS — N950 Postmenopausal bleeding: Secondary | ICD-10-CM | POA: Insufficient documentation

## 2023-07-21 NOTE — Progress Notes (Unsigned)
 GYNECOLOGY  VISIT   HPI: 48 y.o.   Single  African American female   [id number] with No LMP recorded (lmp unknown). Patient is postmenopausal.   here for: Endometrial Biopsy for postmenopausal bleeding.   Bleeding started again last week.   Patient states she is worried.   She had bleeding in April and then again this month.   Her last period was years ago.  Stopped progesterone only pills around October, 2023.  LH 53.7, FSH 79.6, E2 < 5 on 12/10/21 [facility].  Pelvic US  06/26/23: Uterus 7.73 x 4.85 x 3.72 cm.  3 intramural fibroids: 1.02 cm, 1.30 cm, 0.97 cm.  EMS 7.52 mm.  No definite mass, avascular.  Left ovary 2.05 x 1.10 x 1.40 cm.  Normal.  Right ovary 2.26 x 1.10 x 1.10 cm.  Normal.  No adnexal masses.  No free fluid.   GYNECOLOGIC HISTORY: No LMP recorded (lmp unknown). Patient is postmenopausal. Contraception:  PMP Menopausal hormone therapy:  n/a Last 2 paps:  04/17/23 neg HR HPV neg, 03/13/22 HR HPV + History of abnormal Pap or positive HPV:  yes Mammogram:  05/03/22 Breast Density Cat B, BIRADS Cat 1 neg         OB History     Gravida  2   Para  1   Term      Preterm  1   AB  1   Living  0      SAB  1   IAB  0   Ectopic  0   Multiple  0   Live Births  0              Patient Active Problem List   Diagnosis Date Noted   Amenorrhea due to oral contraceptive 12/05/2020   Family history of colon cancer 09/26/2015   Folliculitis 09/26/2015   Excessive sweating 09/26/2015   Open wound of face 07/07/2014   Current smoker 05/18/2014   Genital herpes 05/11/2013    Past Medical History:  Diagnosis Date   Dysmenorrhea    Fibroids    GERD (gastroesophageal reflux disease) 2023   HSV infection    Hypertension    Inguinal adenopathy    left    Past Surgical History:  Procedure Laterality Date   BREAST BIOPSY     CESAREAN SECTION      Current Outpatient Medications  Medication Sig Dispense Refill   dicyclomine  (BENTYL ) 20 MG  tablet Take 1 tablet (20 mg total) by mouth 2 (two) times daily. (Patient not taking: Reported on 07/21/2023) 20 tablet 0   fluconazole  (DIFLUCAN ) 100 MG tablet 2 tablets by mouth the first day, then 1 tablet daily for 6 more days. (Patient not taking: Reported on 07/21/2023) 8 tablet 0   No current facility-administered medications for this visit.     ALLERGIES: Patient has no known allergies.  Family History  Problem Relation Age of Onset   Hypertension Mother    Kidney disease Mother    Diabetes Maternal Aunt    Colon cancer Maternal Aunt    Hypertension Maternal Grandmother    Breast cancer Neg Hx     Social History   Socioeconomic History   Marital status: Single    Spouse name: Not on file   Number of children: 1   Years of education: 16   Highest education level: Bachelor's degree (e.g., BA, AB, BS)  Occupational History   Occupation: IT sales professional  Tobacco Use   Smoking status:  Every Day    Current packs/day: 0.50    Average packs/day: 0.5 packs/day for 10.0 years (5.0 ttl pk-yrs)    Types: Cigarettes   Smokeless tobacco: Never  Vaping Use   Vaping status: Never Used  Substance and Sexual Activity   Alcohol use: Yes    Alcohol/week: 3.0 standard drinks of alcohol    Types: 3 Shots of liquor per week    Comment: couple days out of the week   Drug use: No   Sexual activity: Yes    Partners: Male    Birth control/protection: Post-menopausal  Other Topics Concern   Not on file  Social History Narrative   Fun: Watching tv   Denies any religious beliefs effecting health care.    Social Drivers of Health   Financial Resource Strain: Medium Risk (07/18/2023)   Overall Financial Resource Strain (CARDIA)    Difficulty of Paying Living Expenses: Somewhat hard  Food Insecurity: Food Insecurity Present (07/18/2023)   Hunger Vital Sign    Worried About Running Out of Food in the Last Year: Sometimes true    Ran Out of Food in the Last Year: Sometimes true   Transportation Needs: No Transportation Needs (07/18/2023)   PRAPARE - Administrator, Civil Service (Medical): No    Lack of Transportation (Non-Medical): No  Physical Activity: Insufficiently Active (07/18/2023)   Exercise Vital Sign    Days of Exercise per Week: 1 day    Minutes of Exercise per Session: 30 min  Stress: Stress Concern Present (07/18/2023)   [employer] of Occupational Health - Occupational Stress Questionnaire    Feeling of Stress : To some extent  Social Connections: Moderately Integrated (07/18/2023)   Social Connection and Isolation Panel [NHANES]    Frequency of Communication with Friends and Family: More than three times a week    Frequency of Social Gatherings with Friends and Family: Once a week    Attends Religious Services: More than 4 times per year    Active Member of [employer] or Organizations: Yes    Attends Engineer, structural: More than 4 times per year    Marital Status: Never married  Intimate Partner Violence: Not At Risk (12/10/2021)   Received from [employer], [facility]   HITS    Over the last 12 months how often did your partner physically hurt you?: Never    Over the last 12 months how often did your partner insult you or talk down to you?: Fairly often    Over the last 12 months how often did your partner threaten you with physical harm?: Never    Over the last 12 months how often did your partner scream or curse at you?: Sometimes    Review of Systems  All other systems reviewed and are negative.   PHYSICAL EXAMINATION:   BP 134/88 (BP Location: Left Arm, Patient Position: Sitting, Cuff Size: Normal)   Pulse (!) 113   LMP  (LMP Unknown)   SpO2 100%     General appearance: alert, cooperative and appears stated age.     Endometrial biopsy Consent for procedure. Time out done. Sterile prep with Hibiclens.  Paracervical block with 1% lidocaine 10 cc, lot number    [id number], expiration August 2026. Tenaculum to  anterior cervical lip. Pipelle passed to     7     cm twice.   Tissue to pathology.  Minimal EBL. No complications.   Chaperone was present for exam:  Jada M, CMA  ASSESSMENT:  Postmenopausal bleeding.  Thickened endometrium.   PLAN:  FU EMB.  Final plan to follow.

## 2023-07-21 NOTE — Telephone Encounter (Signed)
 Per BS: Please offer the pt an appt for today in the PM for EMB but make sure she is aware that this is a work-in appt.   Per appt desk; pt accepted appt for today w/ BS for EMB at 415pm, arrival at 400pm.   Encounter routed for final review and closed.

## 2023-07-21 NOTE — Patient Instructions (Signed)
 Endometrial Biopsy  An endometrial biopsy is a procedure where a tissue sample is removed from the lining of the uterus. This lining is called the endometrium. The tissue sample is then sent to a lab for testing. You may have this type of biopsy to check for: Cancer. Infection. Growths called polyps. Uterine bleeding that can't be explained. Tell a health care provider about: Any allergies you have. All medicines you're taking including vitamins, herbs, eye drops, creams, and over-the-counter medicines. Any problems you or family members have had with anesthesia. Any bleeding problems you have. Any surgeries you have had. Any medical problems you have. Whether you're pregnant or may be pregnant. What are the risks? Your health care provider will talk with you about risks. These may include: Bleeding. Infection. Allergic reactions to medicines. Damage to the wall of the uterus. This is rare. What happens before the procedure? Keep track of your period. You may need to have this biopsy when you're not having your period. Ask your provider about: Changing or stopping your regular medicines. These include any diabetes medicines or blood thinners you take. Taking medicines such as aspirin and ibuprofen. These medicines can thin your blood. Do not take them unless your provider tells you to. Taking over-the-counter medicines, vitamins, herbs, and supplements. Bring a pad with you. You may need to wear one after the biopsy. Plan to have a responsible adult take you home from the hospital or clinic. You won't be allowed to drive. What happens during the procedure? A tool will be put into your vagina to hold it open. This helps your provider see the cervix. The cervix is the lowest part of the uterus. Your cervix will be cleaned with a solution that kills germs. You will be given anesthesia. This keeps you from feeling pain. It will numb your cervix. A tool called forceps will be used to  hold your cervix steady. A thin tool called a uterine sound will be put through your cervix. It will be used to: Find the length of your uterus. Find where to take the sample from. A soft tube called a catheter will be put into your uterus. The catheter will remove a tissue sample. The tube and tools will be removed. The sample will be sent to a lab for testing. The procedure may vary among providers and hospitals. What happens after the procedure? Your blood pressure, heart rate, breathing rate, and blood oxygen level will be monitored until you leave the hospital or clinic. It's up to you to get the results of your procedure. Ask your provider, or the department that is doing the procedure, when your results will be ready. This information is not intended to replace advice given to you by your health care provider. Make sure you discuss any questions you have with your health care provider. Document Revised: 05/07/2022 Document Reviewed: 05/07/2022 Elsevier Patient Education  2024 [employer].

## 2023-07-22 ENCOUNTER — Ambulatory Visit: Payer: Self-pay | Admitting: Physician Assistant

## 2023-07-23 ENCOUNTER — Ambulatory Visit: Payer: Self-pay | Admitting: Obstetrics and Gynecology

## 2023-07-23 LAB — SURGICAL PATHOLOGY

## 2023-07-24 ENCOUNTER — Other Ambulatory Visit: Payer: Self-pay | Admitting: Obstetrics and Gynecology

## 2023-07-24 MED ORDER — MEDROXYPROGESTERONE ACETATE 10 MG PO TABS: 10.0000 mg | ORAL_TABLET | Freq: Every day | ORAL | 0 refills | Status: AC

## 2023-07-24 NOTE — Progress Notes (Signed)
 Rx for Provera 10 mg po q day.  #30, RF none.

## 2023-07-29 ENCOUNTER — Ambulatory Visit: Admitting: Obstetrics and Gynecology

## 2023-07-31 ENCOUNTER — Other Ambulatory Visit

## 2023-07-31 ENCOUNTER — Ambulatory Visit: Admitting: Physician Assistant

## 2023-08-02 LAB — GI PROFILE, STOOL, PCR

## 2023-08-04 ENCOUNTER — Other Ambulatory Visit: Payer: Self-pay | Admitting: Physician Assistant

## 2023-08-04 DIAGNOSIS — R634 Abnormal weight loss: Secondary | ICD-10-CM

## 2023-08-04 DIAGNOSIS — R197 Diarrhea, unspecified: Secondary | ICD-10-CM

## 2023-08-05 LAB — OVA AND PARASITE EXAMINATION
CONCENTRATE RESULT:: NONE SEEN
MICRO NUMBER:: 16489755
SPECIMEN QUALITY:: ADEQUATE
TRICHROME RESULT:: NONE SEEN

## 2023-08-05 LAB — CLOSTRIDIUM DIFFICILE CULTURE-FECAL

## 2023-08-14 ENCOUNTER — Encounter: Payer: Self-pay | Admitting: Physician Assistant

## 2023-08-25 ENCOUNTER — Other Ambulatory Visit: Payer: Self-pay | Admitting: Physician Assistant

## 2023-08-25 DIAGNOSIS — Z1231 Encounter for screening mammogram for malignant neoplasm of breast: Secondary | ICD-10-CM

## 2023-08-26 NOTE — Progress Notes (Deleted)
 Brigitte Canard, PA-C [address] [place], [place]  [address] Phone: [phone]   Gastroenterology Consultation  Referring Provider:     Alexander Iba, [place] Primary Care Physician:  Allwardt, Deleta Felix, PA-C Primary Gastroenterologist:  Brigitte Canard, PA-C / *** Reason for Consultation:     Diarrhea, weight loss        HPI:   Tiffany [name] is a 48 y.o. y/o female referred for consultation & management  by Allwardt, Deleta Felix, PA-C.  Patient is referred to evaluate diarrhea and weight loss.  Diarrhea started around 1 April, 2 months ago.  Current symptoms:  07/18/2023 labs: Normal CBC and CMP.  Hgb 13.0.  Normal iron studies.  CRP less than 1.  Normal TSH.  07/31/2023 stool studies: GI pathogen panel, C. difficile PCR, and ova/parasite all negative.  02/2019 colonoscopy: By Dr. Cristina Donath at [facility]: 35 to 40 mm tubular adenoma with features of mucosal prolapse polyp removed from sigmoid colon.  2 other smaller 6 mm, 4 mm tubular adenoma polyps removed.  All negative for dysplasia.  Mild diverticulosis.  Good prep.  06/2019 sigmoidoscopy: By Dr. Cristina Donath: 3 mm rectal polyp removed.  2 smaller 9 mm, 7 mm polypoid mucosa benign prolapse polyps removed from sigmoid colon at the site of previous polypectomy.  Negative for dysplasia.  Repeat colonoscopy in 3 years (due 06/2022).    Past Medical History:  Diagnosis Date   Dysmenorrhea    Fibroids    GERD (gastroesophageal reflux disease) 2023   HSV infection    Hypertension    Inguinal adenopathy    left    Past Surgical History:  Procedure Laterality Date   BREAST BIOPSY     CESAREAN SECTION      Prior to Admission medications   Medication Sig Start Date End Date Taking? Authorizing Provider  medroxyPROGESTERone  (PROVERA ) 10 MG tablet Take 1 tablet (10 mg total) by mouth daily. 07/24/23   Greta Leatherwood, MD  dicyclomine  (BENTYL ) 20 MG tablet Take 1 tablet (20 mg total) by mouth 2 (two) times daily. Patient not  taking: Reported on 07/21/2023 06/16/23   Reddick, Johnathan B, NP  fluconazole  (DIFLUCAN ) 100 MG tablet 2 tablets by mouth the first day, then 1 tablet daily for 6 more days. Patient not taking: Reported on 07/21/2023 07/11/23   Ann Keto, MD    Family History  Problem Relation Age of Onset   Hypertension Mother    Kidney disease Mother    Diabetes Maternal Aunt    Colon cancer Maternal Aunt    Hypertension Maternal Grandmother    Breast cancer Neg Hx      Social History   Tobacco Use   Smoking status: Every Day    Current packs/day: 0.50    Average packs/day: 0.5 packs/day for 10.0 years (5.0 ttl pk-yrs)    Types: Cigarettes   Smokeless tobacco: Never  Vaping Use   Vaping status: Never Used  Substance Use Topics   Alcohol use: Yes    Alcohol/week: 3.0 standard drinks of alcohol    Types: 3 Shots of liquor per week    Comment: couple days out of the week   Drug use: No    Allergies as of 08/27/2023   (No Known Allergies)    Review of Systems:    All systems reviewed and negative except where noted in HPI.   Physical Exam:  LMP  (LMP Unknown)  No LMP recorded (lmp unknown).  Patient is postmenopausal.  General:   Alert,  Well-developed, well-nourished, pleasant and cooperative in NAD Lungs:  Respirations even and unlabored.  Clear throughout to auscultation.   No wheezes, crackles, or rhonchi. No acute distress. Heart:  Regular rate and rhythm; no murmurs, clicks, rubs, or gallops. Abdomen:  Normal bowel sounds.  No bruits.  Soft, and non-distended without masses, hepatosplenomegaly or hernias noted.  No Tenderness.  No guarding or rebound tenderness.    Neurologic:  Alert and oriented x3;  grossly normal neurologically. Psych:  Alert and cooperative. Normal mood and affect.  Imaging Studies: No results found.  Labs: CBC    Component Value Date/Time   WBC 6.5 07/18/2023 1316   RBC 3.37 (L) 07/18/2023 1316   HGB 13.0 07/18/2023 1316   HGB 14.0 03/20/2022  1351   HCT 38.3 07/18/2023 1316   PLT 226.0 07/18/2023 1316   PLT 273 03/20/2022 1351   MCV 113.7 Repeated and verified X2. (H) 07/18/2023 1316   MCH 32.4 03/20/2022 1351   MCHC 33.8 07/18/2023 1316   RDW 17.7 (H) 07/18/2023 1316   LYMPHSABS 1.2 07/18/2023 1316   MONOABS 0.7 07/18/2023 1316   EOSABS 0.0 07/18/2023 1316   BASOSABS 0.0 07/18/2023 1316    CMP     Component Value Date/Time   NA 140 07/18/2023 1316   K 4.1 07/18/2023 1316   CL 102 07/18/2023 1316   CO2 27 07/18/2023 1316   GLUCOSE 101 (H) 07/18/2023 1316   BUN 10 07/18/2023 1316   CREATININE 0.78 07/18/2023 1316   CREATININE 0.98 03/20/2022 1351   CALCIUM 9.1 07/18/2023 1316   PROT 6.6 07/18/2023 1316   ALBUMIN 4.2 07/18/2023 1316   AST 28 07/18/2023 1316   AST 18 03/20/2022 1351   ALT 23 07/18/2023 1316   ALT 18 03/20/2022 1351   ALKPHOS 106 07/18/2023 1316   BILITOT 0.4 07/18/2023 1316   BILITOT 0.6 03/20/2022 1351   GFRNONAA >60 03/20/2022 1351    Assessment and Plan:   Verda Mehta is a 48 y.o. y/o female has been referred for   1.  Diarrhea: Recent stool studies negative for infections. - Schedule colonoscopy to check for microscopic colitis or IBD. - Celiac labs?  2.  History of adenomatous colon polyps.  A large 3 cm to 4 cm tubular adenoma removed from sigmoid colon 02/2019, no dysplasia. - 3-year repeat surveillance colonoscopy is overdue. - Scheduling Colonoscopy I discussed risks of colonoscopy with patient to include risk of bleeding, colon perforation, and risk of sedation.  Patient expressed understanding and agrees to proceed with colonoscopy.   Follow up ***  Brigitte Canard, PA-C

## 2023-08-27 ENCOUNTER — Ambulatory Visit: Admitting: Physician Assistant

## 2023-09-03 ENCOUNTER — Inpatient Hospital Stay: Admission: RE | Admit: 2023-09-03 | Payer: Self-pay | Source: Ambulatory Visit

## 2023-09-03 DIAGNOSIS — Z1231 Encounter for screening mammogram for malignant neoplasm of breast: Secondary | ICD-10-CM
